# Patient Record
Sex: Male | Born: 1990
Health system: Southern US, Community
[De-identification: ages and names within clinical notes are randomized; demographics above are authoritative.]

## PROBLEM LIST (undated history)

## (undated) DIAGNOSIS — J45909 Unspecified asthma, uncomplicated: Secondary | ICD-10-CM

---
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
## 2010-03-31 NOTE — Assessment & Plan Note (Signed)
Summary: f/u,df   Vital Signs:  Patient profile:   20 year old male Weight:      155 pounds Temp:     98.1 degrees F oral Pulse rate:   75 / minute BP sitting:   124 / 72  (right arm) Cuff size:   regular  Vitals Entered By: Tessie Fass CMA (Jul 05, 2009 4:15 PM) CC: F/U Is Patient Diabetic? No Pain Assessment Patient in pain? no        Primary Care Provider:  Ardeen Garland  MD  CC:  F/U.  History of Present Illness: Edward Grant comes in with his mom for f/u of abdominal issues.  See note from 4/12 for details.  He reports improvement since that visit.  For last 2-3 weeks he has had no issues at all.  He has modified his diet - more fiber, less spicy foods - and is feeling much better.  All symptoms now resolved, or at least appear to be.  THough they are somewhat nervous as symptoms had been present since January as per 4/12 note. Also endorses sneezing, itchy watery eyes, and runny nose.  Like his usual allergies.  Used to be on zyrtec and flonase and they worked well for him.   Habits & Providers  Alcohol-Tobacco-Diet     Tobacco Status: never  Current Medications (verified): 1)  Flonase 50 Mcg/act Susp (Fluticasone Propionate) .... 2 Sprays Each Nostril Daily For Allergies 2)  Zyrtec Allergy 10 Mg Tabs (Cetirizine Hcl) .Marland Kitchen.. 1 Tab By Mouth Daily For Allergies. 3)  Ventolin Hfa 108 (90 Base) Mcg/act Aers (Albuterol Sulfate) .... 2 Sprays Inhaled Q 4 Hrs As Needed Cough or Wheezing or Shortness of Breath  Allergies: No Known Drug Allergies  Physical Exam  General:  alert, NAD, cooperative vitals reviewed.  Eyes:  pupils equal, pupils round, corneas and lenses clear, and no injection.   Ears:  R ear normal and L ear normal.   Nose:  mucosa pale and edematous Mouth:  postnasal drip Lungs:  Normal respiratory effort, chest expands symmetrically. Lungs are clear to auscultation, no crackles or wheezes. Heart:  Normal rate and regular rhythm. S1 and S2 normal without  gallop, murmur, click, rub or other extra sounds. Abdomen:  Bowel sounds positive,abdomen soft and non-tender without masses, organomegaly or hernias noted.   Impression & Recommendations:  Problem # 1:  GASTROENTERITIS WITHOUT DEHYDRATION (ICD-558.9) Assessment Improved  Resolved.  Will defer work up for abdominal pathology (celiac, crohns, UC vs IBS) for nwo as he is improved.  THey are in agreement with this plan.  Will let me know if symptoms return and we can consider GI referral.  The following medications were removed from the medication list:    Loperamide Hcl 2 Mg Tabs (Loperamide hcl) .Marland Kitchen... 2 tabs now, then 1 tab after every loose stool.  no more than 7 tabs in a day.  can be bought over the counter.  Orders: [facility]- Est  Level 4 ([address])  Problem # 2:  ALLERGIC RHINITIS (ICD-477.9) Assessment: New  Restart flonase and zyrtec.  The following medications were removed from the medication list:    Promethazine Hcl 12.5 Mg Tabs (Promethazine hcl) .Marland Kitchen... 1 tab by mouth every 6 hours as needed for nausea His updated medication list for this problem includes:    Flonase 50 Mcg/act Susp (Fluticasone propionate) .Marland Kitchen... 2 sprays each nostril daily for allergies    Zyrtec Allergy 10 Mg Tabs (Cetirizine hcl) .Marland Kitchen... 1 tab by mouth daily  for allergies.  Orders: [facility]- Est  Level 4 ([address])  Complete Medication List: 1)  Flonase 50 Mcg/act Susp (Fluticasone propionate) .... 2 sprays each nostril daily for allergies 2)  Zyrtec Allergy 10 Mg Tabs (Cetirizine hcl) .Marland Kitchen.. 1 tab by mouth daily for allergies. 3)  Ventolin Hfa 108 (90 Base) Mcg/act Aers (Albuterol sulfate) .... 2 sprays inhaled q 4 hrs as needed cough or wheezing or shortness of breath Prescriptions: VENTOLIN HFA 108 (90 BASE) MCG/ACT AERS (ALBUTEROL SULFATE) 2 sprays inhaled q 4 hrs as needed cough or wheezing or shortness of breath  #1 x 3   Entered and Authorized by:   Ardeen Garland  MD   Signed by:   Ardeen Garland  MD on 07/05/2009    Method used:   [facility]  [place] [address]       [address]       [place], [place]  [address]       Ph: [phone] or [phone]       Fax: [phone]   RxID:   [id number] ZYRTEC ALLERGY 10 MG TABS (CETIRIZINE HCL) 1 tab by mouth daily for allergies.  #33 x 5   Entered and Authorized by:   Ardeen Garland  MD   Signed by:   Ardeen Garland  MD on 07/05/2009   Method used:   [facility]        [facility]  [place] [address] [facility]       [address]       [place], [place]  [address]       Ph: [phone] or [phone]       Fax: [phone]   RxID:   [id number] FLONASE 50 MCG/ACT SUSP (FLUTICASONE PROPIONATE) 2 sprays each nostril daily for allergies  #1 x 5   Entered and Authorized by:   Ardeen Garland  MD   Signed by:   Ardeen Garland  MD on 07/05/2009   Method used:   [facility]  [place] [address] [facility]       [address]       [place], [place]  [address]       Ph: [phone] or [phone]       Fax: [phone]   RxID:   [id number]
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
## 2010-03-31 NOTE — Assessment & Plan Note (Signed)
Summary: anxiety,tcb   Vital Signs:  Patient profile:   20 year old male Weight:      155 pounds Pulse rate:   98 / minute BP sitting:   140 / 80  (right arm)  Vitals Entered By: Renato Battles slade,cma CC: feels like has anxiety x 1 year. feels shaky when in a social situation.  Is Patient Diabetic? No Pain Assessment Patient in pain? no        Primary Care Provider:  Ardeen Garland  MD  CC:  feels like has anxiety x 1 year. feels shaky when in a social situation. Marland Kitchen  History of Present Illness: Edward Grant comes in with his mom to talk about anxiety.  He states for the last year he seems to get nervous around people and he might shake a little and his voice quivers.  He stataes he has a lot of friends at school but he isn't allowed to hang out with them much outside of school.  He feels like when he hasn't seen people in awhile he doesn't know what to say and feels out of the looop and this makes him nervous.  Plays video games multiple hours a day in a row.  Mom states she would let him go out with friends but he never asks.  He stated he doesn't ask because she always says no.  Apparently at one point in time was with some friends that caused a little trouble, but better since.  Anxiety does not disrupt his life.  He is able to function normally at school.  Actually, as said, has a lot of friends.  Does okay at home but sometimes voice quivers at home. Also mentions allergies.  His eyes get very itchy and red this time of year.  Doesn't relly have a runny or stuffy  nose.  All eye discomfort.   Physical Exam  Eyes:  conjunctiva inflamed, slightly swollen. Psych:  normal attention and interaction.  No depressed or anxious appearing.  Very pleasant and well-behaved.     Habits & Providers  Alcohol-Tobacco-Diet     Tobacco Status: quit  Social History: Smoking Status:  quit   Impression & Recommendations:  Problem # 1:  ANXIETY (ICD-300.00) Assessment New  Does not seem to be  abnormal anxiety.  Seems like normal teenage social situations and responses.  Also appears to be affected by poor communication between mom and patient.  Though they do seem to have a close relationship.  Discussed trying to communicate more openly.  Discussed as he is getting older he may need a little more freedom to go out with friends but at same time discussed with him he needs to stay out of trouble to keep his mother's trust.  Mother became tearful, feeling liek she was being too clingy.  Reassured her she is a good mother, they just need to communicate more.    Orders: [facility]- Est Level  3 ([address])  Problem # 2:  CONJUNCTIVITIS, ALLERGIC (ICD-372.14) Assessment: New  No nasal symptoms so will try patanol eye drops.  His updated medication list for this problem includes:    Patanol 0.1 % Soln (Olopatadine hcl) .Marland Kitchen... 1 drop in each eye two times a day  dispense: 1 bottle  Orders: [facility]- Est Level  3 ([address])  Medications Added to Medication List This Visit: 1)  Patanol 0.1 % Soln (Olopatadine hcl) .Marland Kitchen.. 1 drop in each eye two times a day  dispense: 1 bottle  Patient Instructions: 1)  Sounds like  you two have a pretty good relationship - you just need to talk more! 2)  Try to limit the video games and TV to less than 2 hours a day and spend more time with friends and people. 3)  Try the patanol drops for you eyes.  If that does't work, let me know. Prescriptions: PATANOL 0.1 % SOLN (OLOPATADINE HCL) 1 drop in each eye two times a day  dispense: 1 bottle  #1 x 5   Entered and Authorized by:   Ardeen Garland  MD   Signed by:   Ardeen Garland  MD on 08/17/2008   Method used:   Print then Give to Patient   RxID:   [id number]

## 2010-03-31 NOTE — Progress Notes (Signed)
Summary: resch   Phone Note Call from Patient   Caller: Mom Summary of Call: called to resch appt. next appt is 6/21 Initial call taken by: De Nurse,  Jul 21, 2008 3:26 PM

## 2010-03-31 NOTE — Assessment & Plan Note (Signed)
Summary: throwing up,tcb   Vital Signs:  Patient profile:   20 year old male Weight:      155 pounds Temp:     97.8 degrees F Pulse rate:   72 / minute BP sitting:   115 / 78  Vitals Entered By: Jone Baseman CMA (June 08, 2009 1:41 PM) CC: throwing up x 3 days Is Patient Diabetic? No Pain Assessment Patient in pain? no        Primary Care Provider:  Ardeen Garland  MD  CC:  throwing up x 3 days.  History of Present Illness: 20 yo male with 3 day history of nausea, vomiting, diarrhea.  Started with vomiting when he awoke on Sunday, then developed diarrhea that same day.  Also with intermittent crampy epigastric pain.  Pain relieved by defecation.  Has had both ever since.  Currently feeling nauseated.  Has vomited x2 today, no bowel movement today.  Denies blood in vomit and stool, fever, dysuria.  Has normal UOP and intake of fluids by mouth.  Has been having similar episodes almost every week for 1-2 days since January.  Also notices intermittent papular rash of upper arms.  Not taking any medications.  Habits & Providers  Alcohol-Tobacco-Diet     Tobacco Status: never  Allergies (verified): No Known Drug Allergies  Social History: Smoking Status:  never  Physical Exam  Additional Exam:  VITALS:  Reviewed, normal GEN: Alert & oriented, no acute distress NECK: Midline trachea, no masses/thyromegaly, no cervical lymphadenopathy CARDIO: Regular rate and rhythm, no murmurs/rubs/gallops, 2+ bilateral radial pulses RESP: Clear to auscultation, normal work of breathing, no retractions/accessory muscle use ABD: Normoactive bowel sounds, nontender, no masses/hepatosplenomegaly EXT: Nontender, no edema SKIN: Papular rash extensor surface of upper arms bilateral    Impression & Recommendations:  Problem # 1:  GASTROENTERITIS WITHOUT DEHYDRATION (ICD-558.9) Assessment New  Symptomatic support for acute episode:  Phenergan, Loperamide.  Acute episode consistent with  acute gastroenteritis, but chronic pattern suggestive of underlying pathology, meets Rome III criteria for IBS but cannot diagnose this without excluding other conditions.  Will check CMet today, f/u 1 week with PCP.  Would consider Celiac testing, stool cultures and 24h stool collection +/- colonoscopy if symptoms persist.  Orders: [facility]- Est Level  3 ([address]) Comp Met-[facility] ([address])  His updated medication list for this problem includes:    Loperamide Hcl 2 Mg Tabs (Loperamide hcl) .Marland Kitchen... 2 tabs now, then 1 tab after every loose stool.  no more than 7 tabs in a day.  can be bought over the counter.  Complete Medication List: 1)  Loperamide Hcl 2 Mg Tabs (Loperamide hcl) .... 2 tabs now, then 1 tab after every loose stool.  no more than 7 tabs in a day.  can be bought over the counter. 2)  Promethazine Hcl 12.5 Mg Tabs (Promethazine hcl) .Marland Kitchen.. 1 tab by mouth every 6 hours as needed for nausea  Patient Instructions: 1)  Pleasure to meet you. 2)  Use Loperamide (over the counter) and Promethazine ([facility]) as directed below. 3)  Please schedule a follow-up appointment in 1 week with Dr. Georgiana Shore. Prescriptions: PROMETHAZINE HCL 12.5 MG TABS (PROMETHAZINE HCL) 1 tab by mouth every 6 hours as needed for nausea  #10 x 0   Entered and Authorized by:   Romero Belling MD   Signed by:   Romero Belling MD on 06/08/2009   Method used:   [facility]  [place] [address]       [address]       [place], [place]  [address]       Ph: [phone] or [phone]       Fax: [phone]   RxID:   [id number]   Appended Document: throwing up,tcb   Medication Administration  Injection # 1:    Medication: Promethazine up to 50mg     Diagnosis: GASTROENTERITIS WITHOUT DEHYDRATION (ICD-558.9)    Route: IM    Site: LUOQ gluteus    Exp Date: 01/28/2011    Lot #: [id number]    Mfr: baxter    Patient tolerated injection without complications    Given by: Jone Baseman CMA (June 08, 2009 2:43 PM)  Orders Added: 1)  Promethazine up to 50mg  [J2550]

## 2010-03-31 NOTE — Progress Notes (Signed)
Summary: Triage   Phone Note Call from Patient Call back at [phone]   Caller: Mom Summary of Call: running a fever of 103, wants to know if he can be seen today Initial call taken by: Haydee Salter,  November 27, 2007 9:06 AM  Follow-up for Phone Call        fever up to 102-103. HA, achy, runny nose. states she thinks he has the flu. advised home care . to use otc to treat symptoms. hx asthma in remote past. she will call if he develops any wheezing or she decides he needs to be seen Follow-up by: Golden Circle RN,  November 27, 2007 10:54 AM
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
## 2010-03-31 NOTE — Progress Notes (Signed)
Summary: [place] request  Phone Note Call from Patient Call back at Home Phone [phone]   Caller: Mom Reason for Call: Talk to Nurse Summary of Call: pt needs to get worked in today for bad leg cramps, throwing up, and fever Initial call taken by: Haydee Salter,  May 01, 2006 8:57 AM  Follow-up for Phone Call        phone # given is d/c'd Same # is in registration screen Follow-up by: Golden Circle RN,  May 01, 2006 9:14 AM   Additional Follow-up for Phone Call Additional follow up Details #2::    Phone call from pt's mom, requesting wi appt. today. Pt c/o fever,legs cramps and flu symptoms. Appt. schedule for today wit Dr. Erenest Rasher.

## 2010-03-31 NOTE — Letter (Signed)
Summary: Results Follow-up Letter  [facility]  [address]   [place], [place] [address]   Phone: [phone]  Fax: [phone]    06/09/2009  [address] [place], [place]  [address]  Dear Mr. LAUNER,   The following are the results of your recent test(s):  Electrolytes -- normal Blood Glucose -- normal Kidney Function -- normal Liver Function -- normal  Sincerely,  Romero Belling MD [facility]  Appended Document: Results Follow-up Letter mailed.

## 2010-03-31 NOTE — Letter (Signed)
Summary: Miguel Aschoff Student [facility]  Medical Examinattion Form  See MD box for hardcopy    Appended Document: [facility] Completed and returned to patient.

## 2010-03-31 NOTE — Assessment & Plan Note (Signed)
Summary: legs cramps,fever/gv   Vital Signs:  Patient Profile:   20 Years Old Male Weight:      137 pounds Temp:     98.1 degrees F Pulse rate:   70 / minute BP sitting:   104 / 61  Vitals Entered By: Jone Baseman CMA (May 01, 2006 1:44 PM)               Acute Pediatric Visit History:      The patient presents with vomiting.  He is not having abdominal pain, constipation, diarrhea, fever, or nausea.  Other comments include: Pt w/ 2 episodes of vomiting this am; no stool changes; has eaten lunch and tolerated s vomiting.        Urine output has been normal.              Physical Exam  General:      well hydrated.   Eyes:      PERRL EOMI Mouth:      MMM Abdomen:      no guarding and no rebound.  + BS; Non TTP    Impression & Recommendations:  Problem # 1:  VOMITING (ICD-787.03) Assessment: Improved Pt requesting note for school; apparently has miss # days; Sx completely resolved; no GI changes, no fever, no Ab pain, thus infectious source unlikey; if conts to have am nausea/vomiting, consider GERD and PPI Orders: [facility]- Est Level  3 ([address])

## 2010-03-31 NOTE — Miscellaneous (Signed)
   Clinical Lists Changes  Problems: Changed problem from ASTHMA, UNSPECIFIED (ICD-493.90) to ASTHMA, INTERMITTENT (ICD-493.90) 

## 2010-04-20 ENCOUNTER — Encounter: Payer: Self-pay | Admitting: *Deleted

## 2010-12-07 LAB — POCT RAPID STREP A: Streptococcus, Group A Screen (Direct): NEGATIVE

## 2013-02-19 ENCOUNTER — Encounter (HOSPITAL_COMMUNITY): Payer: Self-pay | Admitting: Emergency Medicine

## 2013-02-19 ENCOUNTER — Emergency Department (HOSPITAL_COMMUNITY)
Admission: EM | Admit: 2013-02-19 | Discharge: 2013-02-19 | Disposition: A | Discharge status: Home / Self Care | Payer: Self-pay | Attending: Emergency Medicine | Admitting: Emergency Medicine

## 2013-02-19 DIAGNOSIS — R197 Diarrhea, unspecified: Secondary | ICD-10-CM | POA: Insufficient documentation

## 2013-02-19 DIAGNOSIS — J45909 Unspecified asthma, uncomplicated: Secondary | ICD-10-CM | POA: Insufficient documentation

## 2013-02-19 DIAGNOSIS — R5381 Other malaise: Secondary | ICD-10-CM | POA: Insufficient documentation

## 2013-02-19 DIAGNOSIS — Z87891 Personal history of nicotine dependence: Secondary | ICD-10-CM | POA: Insufficient documentation

## 2013-02-19 DIAGNOSIS — R259 Unspecified abnormal involuntary movements: Secondary | ICD-10-CM | POA: Insufficient documentation

## 2013-02-19 DIAGNOSIS — R42 Dizziness and giddiness: Secondary | ICD-10-CM | POA: Insufficient documentation

## 2013-02-19 HISTORY — DX: Unspecified asthma, uncomplicated: J45.909

## 2013-02-19 LAB — BASIC METABOLIC PANEL
BUN: 9 mg/dL (ref 6–23)
CO2: 26 mEq/L (ref 19–32)
Calcium: 9.5 mg/dL (ref 8.4–10.5)
Chloride: 103 mEq/L (ref 96–112)
Creatinine, Ser: 0.83 mg/dL (ref 0.50–1.35)
GFR calc Af Amer: >90 mL/min (ref >90)
GFR calc non Af Amer: >90 mL/min (ref >90)
Glucose, Bld: 110 mg/dL — ABNORMAL HIGH (ref 70–99)
Potassium: 4.1 mEq/L (ref 3.5–5.1)
Sodium: 141 mEq/L (ref 135–145)

## 2013-02-19 LAB — CBC
HCT: 44.2 % (ref 39.0–52.0)
Hemoglobin: 15.5 g/dL (ref 13.0–17.0)
MCH: 32 pg (ref 26.0–34.0)
MCHC: 35.1 g/dL (ref 30.0–36.0)
MCV: 91.1 fL (ref 78.0–100.0)
Platelets: 265 10*3/uL (ref 150–400)
RBC: 4.85 MIL/uL (ref 4.22–5.81)
RDW: 12.7 % (ref 11.5–15.5)
WBC: 10.2 10*3/uL (ref 4.0–10.5)

## 2013-02-19 MED ORDER — SODIUM CHLORIDE 0.9 % IV BOLUS (SEPSIS)
1000.0000 mL | INTRAVENOUS | Status: AC
  Administered 2013-02-19: 1000 mL via INTRAVENOUS

## 2013-02-19 MED ORDER — MECLIZINE HCL 25 MG PO TABS
25.0000 mg | ORAL_TABLET | Freq: Once | ORAL | Status: AC
  Administered 2013-02-19: 25 mg via ORAL
  Filled 2013-02-19: qty 1

## 2013-02-19 MED ORDER — MECLIZINE HCL 12.5 MG PO TABS: 12.5000 mg | ORAL_TABLET | Freq: Three times a day (TID) | ORAL | Status: DC | PRN

## 2013-02-19 NOTE — ED Provider Notes (Signed)
CSN: [id number]     Arrival date & time 02/19/13  1021 History   First MD Initiated Contact with Patient 02/19/13 1133     Chief Complaint  Patient presents with  . Dizziness  . Diarrhea   (Consider location/radiation/quality/duration/timing/severity/associated sxs/prior Treatment) HPI Pt is a 22yo male with hx of asthma c/o waking up feeling dizzy with the room spinning and "very shaky."  Pt states he also felt nauseous but did not vomit. Denies hx of similar symptoms. Pt does mention 3-4 episodes of diarrhea yesterday and today but states that has been intermittent for "many years" pt states he believes he has IBS and believes is unrelated to symptoms of dizziness he is experiencing today. Reports feeling well yesterday and had 8 hours of sleep. No known sick contacts or recent travel.      Past Medical History  Diagnosis Date  . Asthma    History reviewed. No pertinent past surgical history. History reviewed. No pertinent family history. History  Substance Use Topics  . Smoking status: Former Games developer  . Smokeless tobacco: Never Used  . Alcohol Use: Yes    Review of Systems  Constitutional: Negative for fever, chills and fatigue.  Respiratory: Negative for cough and shortness of breath.   Gastrointestinal: Positive for diarrhea ( chronic). Negative for nausea, vomiting and abdominal pain.  Neurological: Positive for dizziness, tremors and weakness. Negative for syncope, light-headedness, numbness and headaches.  All other systems reviewed and are negative.    Allergies  Review of patient's allergies indicates no known allergies.  Home Medications   Current Outpatient Rx  Name  Route  Sig  Dispense  Refill  . meclizine (ANTIVERT) 12.5 MG tablet   Oral   Take 1 tablet (12.5 mg total) by mouth 3 (three) times daily as needed for dizziness or nausea.   30 tablet   0    BP 153/90  Pulse 77  Temp(Src) 97.4 F (36.3 C) (Oral)  Resp 14  SpO2 99% Physical Exam  Nursing  note and vitals reviewed. Constitutional: He is oriented to person, place, and time. He appears well-developed and well-nourished.  HENT:  Head: Normocephalic and atraumatic.  Eyes: Conjunctivae are normal. No scleral icterus.  Neck: Normal range of motion.  Cardiovascular: Normal rate, regular rhythm and normal heart sounds.   Pulmonary/Chest: Effort normal and breath sounds normal. No respiratory distress. He has no wheezes. He has no rales. He exhibits no tenderness.  Abdominal: Soft. Bowel sounds are normal. He exhibits no distension and no mass. There is no tenderness. There is no rebound and no guarding.  Musculoskeletal: Normal range of motion.  Neurological: He is alert and oriented to person, place, and time. He has normal strength. No cranial nerve deficit or sensory deficit. Coordination and gait normal. GCS eye subscore is 4. GCS verbal subscore is 5. GCS motor subscore is 6.  Skin: Skin is warm and dry.    ED Course  Procedures (including critical care time) Labs Review Labs Reviewed  BASIC METABOLIC PANEL - Abnormal; Notable for the following:    Glucose, Bld 110 (*)    All other components within normal limits  CBC   Imaging Review No results found.  EKG Interpretation    Date/Time:  Wednesday February 19 2013 10:37:08 EST Ventricular Rate:  76 PR Interval:  124 QRS Duration: 89 QT Interval:  353 QTC Calculation: 397 R Axis:   53 Text Interpretation:  Sinus rhythm No previous ECGs available Confirmed by Manus Gunning  MD,  STEPHEN ([id number]) on 02/19/2013 11:46:08 AM            MDM   1. Vertigo   2. Diarrhea    Symptoms appear vertiginous in nature.  Fluids and antivert given.  Pt appears well, non-toxic. Vitals: unremarkable. Neuro: normal. CBC and BMP: unremarkable.    Pt stated he felt better after fluids and antivert. All labs/imaging/findings discussed with patient. All questions answered and concerns addressed. Will discharge pt home and have pt f/u with  [facility] and [facility] info provided. Also provided info for Gastroenterology for further evaluation and treatment of possible IBS. Return precautions given. Pt verbalized understanding and agreement with tx plan. Vitals: unremarkable. Discharged in stable condition.         Junius Finner, PA-C 02/19/13 [phone]

## 2013-02-19 NOTE — ED Provider Notes (Signed)
Medical screening examination/treatment/procedure(s) were performed by non-physician practitioner and as supervising physician I was immediately available for consultation/collaboration.  EKG Interpretation    Date/Time:  Wednesday February 19 2013 10:37:08 EST Ventricular Rate:  76 PR Interval:  124 QRS Duration: 89 QT Interval:  353 QTC Calculation: 397 R Axis:   53 Text Interpretation:  Sinus rhythm No previous ECGs available Confirmed by Manus Gunning  MD, Latishia Suitt ([id number]) on 02/19/2013 11:46:08 AM              Glynn Octave, MD 02/19/13 1640

## 2013-02-19 NOTE — ED Notes (Signed)
Pt c/o dizziness and tremors starting around 0730 and intermittent diarrhea x "a long time."  Denies pain.

## 2016-10-02 ENCOUNTER — Encounter (HOSPITAL_COMMUNITY): Payer: Self-pay

## 2016-10-02 ENCOUNTER — Emergency Department (HOSPITAL_COMMUNITY)
Admission: EM | Admit: 2016-10-02 | Discharge: 2016-10-02 | Disposition: A | Discharge status: Home / Self Care | Payer: BLUE CROSS/BLUE SHIELD | Attending: Emergency Medicine | Admitting: Emergency Medicine

## 2016-10-02 DIAGNOSIS — R55 Syncope and collapse: Secondary | ICD-10-CM | POA: Diagnosis present

## 2016-10-02 DIAGNOSIS — F419 Anxiety disorder, unspecified: Secondary | ICD-10-CM | POA: Diagnosis not present

## 2016-10-02 DIAGNOSIS — J45909 Unspecified asthma, uncomplicated: Secondary | ICD-10-CM | POA: Insufficient documentation

## 2016-10-02 DIAGNOSIS — Z87891 Personal history of nicotine dependence: Secondary | ICD-10-CM | POA: Diagnosis not present

## 2016-10-02 LAB — I-STAT CHEM 8, ED
BUN: 6 mg/dL (ref 6–20)
Calcium, Ion: 1.07 mmol/L — ABNORMAL LOW (ref 1.15–1.40)
Chloride: 101 mmol/L (ref 101–111)
Creatinine, Ser: 0.8 mg/dL (ref 0.61–1.24)
Glucose, Bld: 107 mg/dL — ABNORMAL HIGH (ref 65–99)
HCT: 51 % (ref 39.0–52.0)
Hemoglobin: 17.3 g/dL — ABNORMAL HIGH (ref 13.0–17.0)
Potassium: 3.4 mmol/L — ABNORMAL LOW (ref 3.5–5.1)
Sodium: 139 mmol/L (ref 135–145)
TCO2: 25 mmol/L (ref 0–100)

## 2016-10-02 NOTE — ED Triage Notes (Signed)
Pt presents for evaluation of possible allergic reaction. Pt reports woke up this AM and felt fine, reports started drinking coffee and had numbness/tingling to mouth with blood shot eyes. Patient reports some cramping to extremities as well. Pt denies new food/creams/exposures. Reports started taking fluoxetine and bupropion about a month ago with no other issues.

## 2016-10-02 NOTE — ED Notes (Signed)
Patient given discharge instructions and verbalized understanding.  Patient stable to discharge at this time.  Patient is alert and oriented to baseline.  No distressed noted at this time.  All belongings taken with the patient at discharge.   
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
## 2016-11-19 ENCOUNTER — Emergency Department (HOSPITAL_COMMUNITY)
Admission: EM | Admit: 2016-11-19 | Discharge: 2016-11-19 | Discharge status: Against Medical Advice | Payer: BLUE CROSS/BLUE SHIELD

## 2016-11-19 ENCOUNTER — Encounter (HOSPITAL_COMMUNITY): Payer: Self-pay | Admitting: *Deleted

## 2016-11-19 ENCOUNTER — Emergency Department (HOSPITAL_COMMUNITY)
Admission: EM | Admit: 2016-11-19 | Discharge: 2016-11-20 | Disposition: A | Discharge status: Other Acute Inpatient Hospital | Payer: BLUE CROSS/BLUE SHIELD | Attending: Emergency Medicine | Admitting: Emergency Medicine

## 2016-11-19 DIAGNOSIS — R45851 Suicidal ideations: Secondary | ICD-10-CM | POA: Insufficient documentation

## 2016-11-19 DIAGNOSIS — F332 Major depressive disorder, recurrent severe without psychotic features: Secondary | ICD-10-CM | POA: Diagnosis not present

## 2016-11-19 DIAGNOSIS — F1014 Alcohol abuse with alcohol-induced mood disorder: Secondary | ICD-10-CM | POA: Diagnosis present

## 2016-11-19 DIAGNOSIS — Z87891 Personal history of nicotine dependence: Secondary | ICD-10-CM | POA: Diagnosis not present

## 2016-11-19 DIAGNOSIS — J45909 Unspecified asthma, uncomplicated: Secondary | ICD-10-CM | POA: Diagnosis not present

## 2016-11-19 DIAGNOSIS — F1010 Alcohol abuse, uncomplicated: Secondary | ICD-10-CM | POA: Diagnosis not present

## 2016-11-19 DIAGNOSIS — F329 Major depressive disorder, single episode, unspecified: Secondary | ICD-10-CM | POA: Diagnosis present

## 2016-11-19 NOTE — ED Notes (Signed)
Bed: [facility] Expected date:  Expected time:  Means of arrival:  Comments: 

## 2016-11-19 NOTE — ED Notes (Signed)
Bed: [facility]6 Expected date:  Expected time:  Means of arrival:  Comments: 
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
## 2016-11-19 NOTE — ED Provider Notes (Signed)
[facility]-EMER[facility] DEPT Provider Note   CSN: [id number] Arrival date & time: 11/19/16  2244     History   Chief Complaint No chief complaint on file.   HPI Edward Grant is a 26 y.o. male.  Patient is a 26 year old male with past medical history of asthma and depression treated with Prozac He was brought by on 47 for evaluation of suicidal threats. According to the IVC paperwork which was filled out by his mother, he was overheard by his sister making remarks that he wanted to harm himself. The mother also is concerned about his excessive use of alcohol and feels as though he is a danger to himself. The patient denies to me that he is suicidal or homicidal and states that he made some remarks that he regrets while he was intoxicated. He has no complaints. 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   The history is provided by the patient.    Past Medical History:  Diagnosis Date  . Asthma     Patient Active Problem List   Diagnosis Date Noted  . ALLERGIC RHINITIS 07/05/2009  . GASTROENTERITIS WITHOUT DEHYDRATION 06/08/2009  . ANXIETY 08/17/2008  . CONJUNCTIVITIS, ALLERGIC 04/26/2006  . ASTHMA, INTERMITTENT 04/26/2006    History reviewed. No pertinent surgical history.     Home Medications    Prior to Admission medications   Medication Sig Start Date End Date Taking? Authorizing Provider  meclizine (ANTIVERT) 12.5 MG tablet Take 1 tablet (12.5 mg total) by mouth 3 (three) times daily as needed for dizziness or nausea. 02/19/13   Lurene Shadow, PA-C    Family History No family history on file.  Social History Social History  Substance Use Topics  . Smoking status: Former Games developer  . Smokeless tobacco: Never Used  . Alcohol use Yes     Allergies   Patient has no known allergies.   Review of Systems Review of Systems  All other systems reviewed and are negative.    Physical  Exam Updated Vital Signs BP (!) 134/95 (BP Location: Left Arm)   Pulse (!) 104   Temp 98.3 F (36.8 C) (Oral)   Resp 18   SpO2 96%   Physical Exam  Constitutional: He is oriented to person, place, and time. He appears well-developed and well-nourished. No distress.  HENT:  Head: Normocephalic and atraumatic.  Mouth/Throat: Oropharynx is clear and moist.  Neck: Normal range of motion. Neck supple.  Cardiovascular: Normal rate and regular rhythm.  Exam reveals no friction rub.   No murmur heard. Pulmonary/Chest: Effort normal and breath sounds normal. No respiratory distress. He has no wheezes. He has no rales.  Abdominal: Soft. Bowel sounds are normal. He exhibits no distension. There is no tenderness.  Musculoskeletal: Normal range of motion. He exhibits no edema.  Neurological: He is alert and oriented to person, place, and time. Coordination normal.  Skin: Skin is warm and dry. He is not diaphoretic.  Psychiatric: He has a normal mood and affect. His speech is normal and behavior is normal. Judgment and thought content normal. He is not actively hallucinating. Cognition and memory are normal. He is attentive.  Nursing note and vitals reviewed.    ED Treatments / Results  Labs (all labs ordered are listed, but only abnormal results are displayed) Labs Reviewed - No data to display  EKG  EKG Interpretation None  Radiology No results found.  Procedures Procedures (including critical care time)  Medications Ordered in ED Medications - No data to display   Initial Impression / Assessment and Plan / ED Course  I have reviewed the triage vital signs and the nursing notes.  Pertinent labs & imaging results that were available during my care of the patient were reviewed by me and considered in my medical decision making (see chart for details).  Patient seen by TTS and felt to meet inpatient criteria. The patient was very unhappy with this. Workup reveals no  significant laboratory abnormality and his physical examination is nonfocal. His blood alcohol is 170. I feel as though he is medically cleared for psychiatric treatment.  Final Clinical Impressions(s) / ED Diagnoses   Final diagnoses:  None    New Prescriptions New Prescriptions   No medications on file     Geoffery Lyons, MD 11/20/16 [phone]

## 2016-11-20 ENCOUNTER — Inpatient Hospital Stay (HOSPITAL_COMMUNITY)
Admission: AD | Admit: 2016-11-20 | Discharge: 2016-11-22 | DRG: 885 | Disposition: A | Discharge status: Home / Self Care | Payer: BLUE CROSS/BLUE SHIELD | Attending: Psychiatry | Admitting: Psychiatry

## 2016-11-20 ENCOUNTER — Encounter (HOSPITAL_COMMUNITY): Payer: Self-pay | Admitting: *Deleted

## 2016-11-20 DIAGNOSIS — Z79899 Other long term (current) drug therapy: Secondary | ICD-10-CM

## 2016-11-20 DIAGNOSIS — F1020 Alcohol dependence, uncomplicated: Secondary | ICD-10-CM | POA: Diagnosis present

## 2016-11-20 DIAGNOSIS — F419 Anxiety disorder, unspecified: Secondary | ICD-10-CM | POA: Diagnosis present

## 2016-11-20 DIAGNOSIS — Z87891 Personal history of nicotine dependence: Secondary | ICD-10-CM | POA: Diagnosis not present

## 2016-11-20 DIAGNOSIS — Z818 Family history of other mental and behavioral disorders: Secondary | ICD-10-CM

## 2016-11-20 DIAGNOSIS — R450 Nervousness: Secondary | ICD-10-CM | POA: Diagnosis not present

## 2016-11-20 DIAGNOSIS — G4700 Insomnia, unspecified: Secondary | ICD-10-CM | POA: Diagnosis present

## 2016-11-20 DIAGNOSIS — F1014 Alcohol abuse with alcohol-induced mood disorder: Secondary | ICD-10-CM | POA: Diagnosis present

## 2016-11-20 DIAGNOSIS — F332 Major depressive disorder, recurrent severe without psychotic features: Secondary | ICD-10-CM | POA: Diagnosis present

## 2016-11-20 DIAGNOSIS — Y906 Blood alcohol level of 120-199 mg/100 ml: Secondary | ICD-10-CM | POA: Diagnosis present

## 2016-11-20 DIAGNOSIS — F17210 Nicotine dependence, cigarettes, uncomplicated: Secondary | ICD-10-CM | POA: Diagnosis not present

## 2016-11-20 DIAGNOSIS — F329 Major depressive disorder, single episode, unspecified: Secondary | ICD-10-CM | POA: Diagnosis present

## 2016-11-20 DIAGNOSIS — J4520 Mild intermittent asthma, uncomplicated: Secondary | ICD-10-CM | POA: Diagnosis present

## 2016-11-20 DIAGNOSIS — F1010 Alcohol abuse, uncomplicated: Secondary | ICD-10-CM | POA: Diagnosis present

## 2016-11-20 LAB — CBC WITH DIFFERENTIAL/PLATELET
Basophils Absolute: 0.1 10*3/uL (ref 0.0–0.1)
Basophils Relative: 1 %
Eosinophils Absolute: 1.5 10*3/uL — ABNORMAL HIGH (ref 0.0–0.7)
Eosinophils Relative: 14 %
HCT: 44.9 % (ref 39.0–52.0)
Hemoglobin: 16 g/dL (ref 13.0–17.0)
Lymphocytes Relative: 30 %
Lymphs Abs: 3.3 10*3/uL (ref 0.7–4.0)
MCH: 33.1 pg (ref 26.0–34.0)
MCHC: 35.6 g/dL (ref 30.0–36.0)
MCV: 93 fL (ref 78.0–100.0)
Monocytes Absolute: 1.2 10*3/uL — ABNORMAL HIGH (ref 0.1–1.0)
Monocytes Relative: 11 %
Neutro Abs: 4.9 10*3/uL (ref 1.7–7.7)
Neutrophils Relative %: 44 %
Platelets: 300 10*3/uL (ref 150–400)
RBC: 4.83 MIL/uL (ref 4.22–5.81)
RDW: 12.7 % (ref 11.5–15.5)
WBC: 11 10*3/uL — ABNORMAL HIGH (ref 4.0–10.5)

## 2016-11-20 LAB — ETHANOL: Alcohol, Ethyl (B): 170 mg/dL — ABNORMAL HIGH (ref <5)

## 2016-11-20 LAB — BASIC METABOLIC PANEL
Anion gap: 14 (ref 5–15)
BUN: 8 mg/dL (ref 6–20)
CO2: 22 mmol/L (ref 22–32)
Calcium: 8.8 mg/dL — ABNORMAL LOW (ref 8.9–10.3)
Chloride: 107 mmol/L (ref 101–111)
Creatinine, Ser: 0.84 mg/dL (ref 0.61–1.24)
GFR calc Af Amer: >60 mL/min (ref >60)
GFR calc non Af Amer: >60 mL/min (ref >60)
Glucose, Bld: 102 mg/dL — ABNORMAL HIGH (ref 65–99)
Potassium: 3.6 mmol/L (ref 3.5–5.1)
Sodium: 143 mmol/L (ref 135–145)

## 2016-11-20 MED ORDER — ACETAMINOPHEN 325 MG PO TABS
650.0000 mg | ORAL_TABLET | Freq: Four times a day (QID) | ORAL | Status: DC | PRN
  Administered 2016-11-20 – 2016-11-22 (×4): 650 mg via ORAL
  Filled 2016-11-20 (×4): qty 2

## 2016-11-20 MED ORDER — LORAZEPAM 1 MG PO TABS: 0.0000 mg | ORAL_TABLET | Freq: Two times a day (BID) | ORAL | Status: DC

## 2016-11-20 MED ORDER — LORAZEPAM 1 MG PO TABS
0.0000 mg | ORAL_TABLET | Freq: Four times a day (QID) | ORAL | Status: DC
  Administered 2016-11-20: 1 mg via ORAL
  Filled 2016-11-20: qty 1

## 2016-11-20 MED ORDER — THIAMINE HCL 100 MG/ML IJ SOLN: 100.0000 mg | Freq: Every day | INTRAMUSCULAR | Status: DC

## 2016-11-20 MED ORDER — VITAMIN B-1 100 MG PO TABS
100.0000 mg | ORAL_TABLET | Freq: Every day | ORAL | Status: DC
  Administered 2016-11-21 – 2016-11-22 (×2): 100 mg via ORAL
  Filled 2016-11-20 (×3): qty 1

## 2016-11-20 MED ORDER — LORAZEPAM 2 MG/ML IJ SOLN: 0.0000 mg | Freq: Four times a day (QID) | INTRAMUSCULAR | Status: DC

## 2016-11-20 MED ORDER — MAGNESIUM HYDROXIDE 400 MG/5ML PO SUSP: 30.0000 mL | Freq: Every day | ORAL | Status: DC | PRN

## 2016-11-20 MED ORDER — ALUM & MAG HYDROXIDE-SIMETH 200-200-20 MG/5ML PO SUSP: 30.0000 mL | ORAL | Status: DC | PRN

## 2016-11-20 MED ORDER — FLUOXETINE HCL 20 MG PO CAPS
20.0000 mg | ORAL_CAPSULE | Freq: Every day | ORAL | Status: DC
  Administered 2016-11-21: 20 mg via ORAL
  Filled 2016-11-20 (×4): qty 1

## 2016-11-20 MED ORDER — GABAPENTIN 100 MG PO CAPS
200.0000 mg | ORAL_CAPSULE | Freq: Two times a day (BID) | ORAL | Status: DC
  Filled 2016-11-20: qty 2

## 2016-11-20 MED ORDER — FLUOXETINE HCL 20 MG PO CAPS
20.0000 mg | ORAL_CAPSULE | Freq: Every day | ORAL | Status: DC
  Administered 2016-11-20: 20 mg via ORAL
  Filled 2016-11-20: qty 1

## 2016-11-20 MED ORDER — LORAZEPAM 2 MG/ML IJ SOLN: 0.0000 mg | Freq: Two times a day (BID) | INTRAMUSCULAR | Status: DC

## 2016-11-20 MED ORDER — VITAMIN B-1 100 MG PO TABS
100.0000 mg | ORAL_TABLET | Freq: Every day | ORAL | Status: DC
  Administered 2016-11-20: 100 mg via ORAL
  Filled 2016-11-20: qty 1

## 2016-11-20 NOTE — ED Notes (Signed)
Psychiatry at bedside.

## 2016-11-20 NOTE — BHH Counselor (Signed)
Per Dr. Akintayo and Jamison Lord, DNP patient meets criteria for inpatient.  

## 2016-11-20 NOTE — BH Assessment (Signed)
Patient accepted for involuntary admission to [facility], [facility] [place], bed #305-2. Please call report to [phone].

## 2016-11-20 NOTE — Progress Notes (Signed)
  Admission Note:  26 year old male who presents IVC, in no acute distress, for the treatment of SI and substance abuse. Patient appeared anxious and tearful throughout admission.  Patient was cooperative with admission process. Patient denies SI on admission stating "I made a suicidal threat to my sister to get under her skin but I didn't mean it". Patient denies AVH.  Patient identifies main stressor as "6 months ago my dad had 3-4 strokes and was in ICU for 3 weeks. I am now his primary care giver and it is a lot of pressure".  Patient states that since father's stroke, patient has been drinking "a 6 pack everyday for 6 months".  Patient verbalizes worry about being at [facility] and not being able to take care of father "give him his medications and make sure he eats". Patient also, verbalized worry about missing out on work from inpatient admission and not being able to cover his bills.  Patient currently lives with father and identifies "friends" as his support system.  While at [facility], patient would like to work on "anxiety" and "drinking".   Skin was assessed.  Patient has bruise to left forearm, left knee, abrasions to elbows bilateral, right eye, and upper lip.  Patient searched and no contraband found, POC and unit policies explained and understanding verbalized. Consents obtained. Food and fluids offered and accepted. Patient had no additional questions or concerns.

## 2016-11-20 NOTE — Progress Notes (Signed)
D   Pt has isolated to his room and has had no complaints   He presents as depressed and anxious    He continues to deny suicidal ideation and denies withdrawal A    Verbal support given   Medications offered     monitored    Q 15 min checks R   Pt remains safe
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
## 2016-11-20 NOTE — Tx Team (Signed)
Initial Treatment Plan 11/20/2016 5:06 PM Edward Grant YQM:[id number]    PATIENT STRESSORS: Financial difficulties Marital or family conflict Substance abuse   PATIENT STRENGTHS: Ability for insight Average or above average intelligence Communication skills Motivation for treatment/growth Physical Health Supportive family/friends   PATIENT IDENTIFIED PROBLEMS: At risk for suicide  Substance Abuse  "anxiety"  "Drinking"               DISCHARGE CRITERIA:  Ability to meet basic life and health needs Improved stabilization in mood, thinking, and/or behavior Motivation to continue treatment in a less acute level of care Need for constant or close observation no longer present Verbal commitment to aftercare and medication compliance Withdrawal symptoms are absent or subacute and managed without 24-hour nursing intervention  PRELIMINARY DISCHARGE PLAN: Attend 12-step recovery group Outpatient therapy Return to previous living arrangement Return to previous work or school arrangements  PATIENT/FAMILY INVOLVEMENT: This treatment plan has been presented to and reviewed with the patient, Edward Grant.  The patient and family have been given the opportunity to ask questions and make suggestions.  Carleene Overlie, RN 11/20/2016, 5:06 PM

## 2016-11-20 NOTE — ED Notes (Signed)
Pt unable to provide urine specimen. He stated, "I went about ten minutes ago."

## 2016-11-20 NOTE — ED Notes (Signed)
[employer] arrived to transport pt.  

## 2016-11-21 DIAGNOSIS — G4700 Insomnia, unspecified: Secondary | ICD-10-CM

## 2016-11-21 DIAGNOSIS — R450 Nervousness: Secondary | ICD-10-CM

## 2016-11-21 DIAGNOSIS — F17210 Nicotine dependence, cigarettes, uncomplicated: Secondary | ICD-10-CM

## 2016-11-21 DIAGNOSIS — F1020 Alcohol dependence, uncomplicated: Secondary | ICD-10-CM | POA: Diagnosis present

## 2016-11-21 DIAGNOSIS — F419 Anxiety disorder, unspecified: Secondary | ICD-10-CM

## 2016-11-21 DIAGNOSIS — F332 Major depressive disorder, recurrent severe without psychotic features: Principal | ICD-10-CM

## 2016-11-21 MED ORDER — ONDANSETRON 4 MG PO TBDP: 4.0000 mg | ORAL_TABLET | Freq: Four times a day (QID) | ORAL | Status: DC | PRN

## 2016-11-21 MED ORDER — ADULT MULTIVITAMIN W/MINERALS CH
1.0000 | ORAL_TABLET | Freq: Every day | ORAL | Status: DC
  Administered 2016-11-21 – 2016-11-22 (×2): 1 via ORAL
  Filled 2016-11-21 (×5): qty 1

## 2016-11-21 MED ORDER — LORAZEPAM 1 MG PO TABS: 1.0000 mg | ORAL_TABLET | Freq: Two times a day (BID) | ORAL | Status: DC

## 2016-11-21 MED ORDER — LORAZEPAM 1 MG PO TABS: 1.0000 mg | ORAL_TABLET | Freq: Three times a day (TID) | ORAL | Status: DC

## 2016-11-21 MED ORDER — LOPERAMIDE HCL 2 MG PO CAPS: 2.0000 mg | ORAL_CAPSULE | ORAL | Status: DC | PRN

## 2016-11-21 MED ORDER — LORAZEPAM 1 MG PO TABS: 1.0000 mg | ORAL_TABLET | Freq: Four times a day (QID) | ORAL | Status: DC

## 2016-11-21 MED ORDER — FLUOXETINE HCL 20 MG PO CAPS
60.0000 mg | ORAL_CAPSULE | Freq: Every day | ORAL | Status: DC
  Administered 2016-11-22: 60 mg via ORAL
  Filled 2016-11-21 (×3): qty 3

## 2016-11-21 MED ORDER — FLUOXETINE HCL 20 MG PO CAPS
40.0000 mg | ORAL_CAPSULE | Freq: Once | ORAL | Status: AC
  Administered 2016-11-21: 40 mg via ORAL
  Filled 2016-11-21: qty 2

## 2016-11-21 MED ORDER — LORAZEPAM 1 MG PO TABS: 1.0000 mg | ORAL_TABLET | Freq: Four times a day (QID) | ORAL | Status: DC | PRN

## 2016-11-21 MED ORDER — VITAMIN B-1 100 MG PO TABS
100.0000 mg | ORAL_TABLET | Freq: Every day | ORAL | Status: DC
  Administered 2016-11-22: 100 mg via ORAL
  Filled 2016-11-21 (×3): qty 1

## 2016-11-21 MED ORDER — HYDROXYZINE HCL 25 MG PO TABS
25.0000 mg | ORAL_TABLET | Freq: Four times a day (QID) | ORAL | Status: DC | PRN
  Administered 2016-11-21: 25 mg via ORAL
  Filled 2016-11-21: qty 1

## 2016-11-21 MED ORDER — LORAZEPAM 1 MG PO TABS: 1.0000 mg | ORAL_TABLET | Freq: Every day | ORAL | Status: DC

## 2016-11-21 NOTE — Progress Notes (Signed)
Recreation Therapy Notes  Animal-Assisted Activity (AAA) Program Checklist/Progress Notes Patient Eligibility Criteria Checklist & Daily Group note for Rec TxIntervention  Date: 09.26.2018 Time: 2:45pm Location: [address]   AAA/T Program Assumption of Risk Form signed by Patient/ or Parent Legal Guardian Yes  Patient is free of allergies or sever asthma Yes  Patient reports no fear of animals Yes  Patient reports no history of cruelty to animals Yes  Patient understands his/her participation is voluntary Yes  Behavioral Response: Did not attend.   Marykay Lex Rica Heather, LRT/CTRS        Myldred Raju L 11/21/2016 3:04 PM

## 2016-11-21 NOTE — BHH Group Notes (Signed)
The focus of this group is to educate the patient on the purpose and policies of crisis stabilization and provide a format to answer questions about their admission.  The group details unit policies and expectations of patients while admitted.  Patient did not attend 0900 nurse education orientation group this morning.  Patient stayed in room.  

## 2016-11-21 NOTE — BHH Group Notes (Signed)
LCSW Group Therapy Note  11/21/2016 1:15pm  Type of Therapy/Topic:  Group Therapy:  Feelings about Diagnosis  Participation Level: Pt invited. Did not attend.   Jonathon Jordan, MSW, LCSWA 11/21/2016 3:45 PM
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
## 2016-11-21 NOTE — BHH Suicide Risk Assessment (Signed)
[facility] Admission Suicide Risk Assessment   Nursing information obtained from:  Patient Demographic factors:  Male Current Mental Status:  Suicidal ideation indicated by others, Suicide plan Loss Factors:  Loss of significant relationship Historical Factors:  Family history of mental illness or substance abuse Risk Reduction Factors:  Living with another person, especially a relative, Positive social support  Total Time spent with patient: 30 minutes Principal Problem: Alcohol use disorder, severe, dependence (HCC) Diagnosis:   Patient Active Problem List   Diagnosis Date Noted  . Alcohol use disorder, severe, dependence (HCC) [F10.20] 11/21/2016  . Alcohol abuse [F10.10] 11/20/2016  . Major depressive disorder, recurrent severe without psychotic features (HCC) [F33.2] 11/20/2016  . ALLERGIC RHINITIS [J30.9] 07/05/2009  . GASTROENTERITIS WITHOUT DEHYDRATION [K52.89] 06/08/2009  . ANXIETY [F41.1] 08/17/2008  . CONJUNCTIVITIS, ALLERGIC [H10.45] 04/26/2006  . ASTHMA, INTERMITTENT [J45.909] 04/26/2006   Subjective Data:  26 y.o Caucasian male, single, lives alone, employed. Personal and family history of addiction. Reports regular use of alcohol. Involuntarily committed by his mother. Reported to have expressed suicidal thoughts while drinking at the bar. BAL at presentation was 170 mg/dl. Patient says his statement was taken out of context. Says he had an argument with his sister while they were watching football. Says he told her that he did not care if he died. Says he never had any suicidal thoughts. Says he went back home and was ready to go to work the following day when the police came. Repeatedly denies any suicidal thoughts. Denies any past suicidal behavior. Feels his mom is concerned because he has been taking Prozac. Says his mom had a bad reaction from Prozac. Patient says he has had a good experience from Prozac. Says he has been having less anxiety on it. Wants to continue his  medication. Says he is feeling good and hopes to back to work soon. Says he has never had withdrawals in the past. No sweatiness, no headaches. No retching, nausea or vomiting. No fullness in the head. No visual, tactile or auditory hallucination. No internal restlessness. No homicidal thoughts.  Says he is able to think clearly. No past suicidal behavior. No family history of suicide. No evidence of mania. No overwhelming anxiety. No insomnia.  We discussed detox and continuation of his SSRI. I explored anticraving medications with him. Patient is not processing relapse preventive measures at this time.    Continued Clinical Symptoms:  Alcohol Use Disorder Identification Test Final Score (AUDIT): 16 The "Alcohol Use Disorders Identification Test", Guidelines for Use in Primary Care, Second Edition.  World Science writer [facility]). Score between 0-7:  no or low risk or alcohol related problems. Score between 8-15:  moderate risk of alcohol related problems. Score between 16-19:  high risk of alcohol related problems. Score 20 or above:  warrants further diagnostic evaluation for alcohol dependence and treatment.   CLINICAL FACTORS:   Depression:   Impulsivity Alcohol/Substance Abuse/Dependencies   Musculoskeletal: Strength & Muscle Tone: within normal limits Gait & Station: normal Patient leans: N/A  Psychiatric Specialty Exam: Physical Exam  Constitutional: He is oriented to person, place, and time. He appears well-developed and well-nourished.  HENT:  Head: Normocephalic and atraumatic.  Respiratory: Effort normal.  Neurological: He is alert and oriented to person, place, and time.  Skin: Skin is warm and dry.  Psychiatric:  As above    ROS  Blood pressure 134/84, pulse 67, temperature 98.2 F (36.8 C), temperature source Oral, resp. rate 18, height  (1.778 m), weight 100.7  kg (222 lb), SpO2 99 %.Body mass index is 31.85 kg/m.  General Appearance: Neatly dressed, Not  shaky, not sweaty, not confused. Not unsteady, normal conjugate eye movements. Not internally distressed. Appropriate behavior.   Eye Contact:  Good  Speech:  Clear and Coherent and Normal Rate  Volume:  Normal  Mood:  Says he feels good  Affect:  Appropriate and Restricted  Thought Process:  Linear  Orientation:  Full (Time, Place, and Person)  Thought Content:  No delusional theme. No preoccupation with violent thoughts. No negative ruminations. No obsession.  No hallucination in any modality.   Suicidal Thoughts:  No  Homicidal Thoughts:  No  Memory:  Immediate;   Good Recent;   Good Remote;   Good  Judgement:  Good  Insight:  Fair  Psychomotor Activity:  Normal  Concentration:  Concentration: Good and Attention Span: Good  Recall:  Good  Fund of Knowledge:  Good  Language:  Good  Akathisia:  Negative  Handed:    AIMS (if indicated):     Assets:  Communication Skills Desire for Improvement Physical Health Resilience Transportation Vocational/Educational  ADL's:  Intact  Cognition:  WNL  Sleep:  Number of Hours: 5.75      COGNITIVE FEATURES THAT CONTRIBUTE TO RISK:  None    SUICIDE RISK:   Minimal: No identifiable suicidal ideation.  Patients presenting with no risk factors but with morbid ruminations; may be classified as minimal risk based on the severity of the depressive symptoms  PLAN OF CARE:  1. Alcohol withdrawal protocol 2. Continue Prozac at current dose 3. Collateral from his family Allow voluntary status.    I certify that inpatient services furnished can reasonably be expected to improve the patient's condition.   Georgiann Cocker, MD 11/21/2016, 5:50 PM
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
## 2016-11-21 NOTE — Progress Notes (Signed)
Adult Psychoeducational Group Note  Date:  11/21/2016 Time:  3:17 AM  Group Topic/Focus:  Wrap-Up Group:   The focus of this group is to help patients review their daily goal of treatment and discuss progress on daily workbooks.  Participation Level:  Did Not Attend  Participation Quality:  Did not attend  Affect:  Did not attend  Cognitive:  Did not attend  Insight: None  Engagement in Group:  Did not attend  Modes of Intervention:  Did not attend  Additional Comments:  Pt did not attend the AA group session today.  Felipa Furnace 11/21/2016, 3:17 AM

## 2016-11-22 MED ORDER — FLUOXETINE HCL 20 MG PO CAPS: 60.0000 mg | ORAL_CAPSULE | Freq: Every day | ORAL | 0 refills | Status: AC

## 2016-11-22 MED ORDER — HYDROXYZINE HCL 25 MG PO TABS: 25.0000 mg | ORAL_TABLET | Freq: Four times a day (QID) | ORAL | 0 refills | Status: DC | PRN

## 2016-11-22 NOTE — Progress Notes (Signed)
  [facility] Adult Case Management Discharge Plan :  Will you be returning to the same living situation after discharge:  Yes,  pt returning home. At discharge, do you have transportation home?: Yes,  pt's sister will transport. Do you have the ability to pay for your medications: Yes,  pt has insurance.  Release of information consent forms completed and in the chart;  Patient's signature needed at discharge.  Patient to Follow up at: Follow-up Information    [facility] Follow up.   Specialty:  Behavioral Health Why:  Therapy appointment 10/15 at 11am w/ Brayton Caves. Please arrive no later than 10am to complete paperwork. Medication managment appointment 10/26 at 9am w/ Dr. Lolly Mustache. These were the first available appointments. Please call the office if you need to cancel. Contact information: [address] [place] [address] [phone]       [facility] Follow up.   Why:  Social worker has made a referral on your behalf. Please call the office upon discharge to schedule an appointment. If they have a therapy appointment available sooner than 10/15 please call [facility] to cancel your other appointment. Thank you. Contact information: [address] [place] [address] [phone]           Next level of care provider has access to [facility] Link:yes  Safety Planning and Suicide Prevention discussed: Yes,  with pt, pt's sister, and pt;s mother.  Have you used any form of tobacco in the last 30 days? (Cigarettes, Smokeless Tobacco, Cigars, and/or Pipes): Yes  Has patient been referred to the Quitline?: Patient refused referral  Patient has been referred for addiction treatment: Yes  Jonathon Jordan, MSW, LCSWA 11/22/2016, 4:09 PM

## 2016-11-22 NOTE — Progress Notes (Signed)
D   Pt is appropriate and pleasant   He denies suicidal and homicidal ideation    He signed his voluntary consent for treatment and his 72 hour request for discharge   He said he is worried about his invalid father and his care not that he is in the hospital A   Verbal support given medications administered and effectivenes monitored   Q 15 min checks R   Pt is safe and receptive to verbal support

## 2016-11-22 NOTE — [facility] Suicide Risk Assessment (Signed)
[facility] INPATIENT:  Family/Significant Other Suicide Prevention Education  Suicide Prevention Education:  Education Completed; Ailene Ravel (sister [phone]), has been identified by the patient as the family member/significant other with whom the patient will be residing, and identified as the person(s) who will aid the patient in the event of a mental health crisis (suicidal ideations/suicide attempt).  With written consent from the patient, the family member/significant other has been provided the following suicide prevention education, prior to the and/or following the discharge of the patient.  The suicide prevention education provided includes the following:  Suicide risk factors  Suicide prevention and interventions  National Suicide Hotline telephone number  [facility] assessment telephone number  [facility] Emergency Assistance 911  [facility] and/or Residential Mobile Crisis Unit telephone number  Request made of family/significant other to:  Remove weapons (e.g., guns, rifles, knives), all items previously/currently identified as safety concern.    Remove drugs/medications (over-the-counter, prescriptions, illicit drugs), all items previously/currently identified as a safety concern.  The family member/significant other verbalizes understanding of the suicide prevention education information provided.  The family member/significant other agrees to remove the items of safety concern listed above.  Pt's sister states that pt was not suicidal prior to admission. Pt's sister states that pt has been under a lot of stress lately because he is the primary caretaker for their father. Pt's sister reports that while pt was drinking one night he stated "I don't care if I live or die". However, she believes that the pt was just frustrated in that moment and truly did not mean what he said. Pt's sister told their mom about what pt said and pt's mother immediately IVC'd  pt without talking to him first. Pt's sister states that pt's mother hasn't seen either of them in months and they usually only see their mother on holidays. Pt's sister states that pt's mother does not know the full situation of what is going on with the pt and overreacted based on the information that was given. Pt's sister states that she believes that pt is stable for discharge and has no concerns about pt leaving today. Pt's sister also confirms that pt does not have access to guns or weapons at home.  Jonathon Jordan, MSW, LCSWA  11/22/2016, 11:43 AM

## 2016-11-22 NOTE — [facility] Suicide Risk Assessment (Signed)
[facility] INPATIENT:  Family/Significant Other Suicide Prevention Education  Suicide Prevention Education:  Education Completed; Lawson Fiscal (mom [phone]), has been identified by the patient as the family member/significant other with whom the patient will be residing, and identified as the person(s) who will aid the patient in the event of a mental health crisis (suicidal ideations/suicide attempt).  With written consent from the patient, the family member/significant other has been provided the following suicide prevention education, prior to the and/or following the discharge of the patient.  The suicide prevention education provided includes the following:  Suicide risk factors  Suicide prevention and interventions  National Suicide Hotline telephone number  [facility] assessment telephone number  [facility] Emergency Assistance 911  [facility] and/or Residential Mobile Crisis Unit telephone number  Request made of family/significant other to:  Remove weapons (e.g., guns, rifles, knives), all items previously/currently identified as safety concern.    Remove drugs/medications (over-the-counter, prescriptions, illicit drugs), all items previously/currently identified as a safety concern.  The family member/significant other verbalizes understanding of the suicide prevention education information provided.  The family member/significant other agrees to remove the items of safety concern listed above.  Pt's mother states that she IVC'd pt based off the information that pt's sister told her. Pt's mother states that she never talked to the pt prior to IVC'ing him and also did not see the pt. Pt's mother states that pt's sister may have made the situation seem worse to her than what it really was. Pt's mother states that she works in [employer] so she was just trying to do the right thing for her son. Pt's mother reports that she hasn't seen the pt in a while because she's  been busy with work. However, she plans to spend more time with the pt upon discharge. Pt's mother states that she thinks pt is stable for discharge and she has no concerns about him leaving today. Pt's mother states "I know he will be fine".  Edward Grant, MSW, LCSWA  11/22/2016, 11:53 AM

## 2016-11-22 NOTE — Discharge Summary (Signed)
Physician Discharge Summary Note  Patient:  Edward Grant is an 26 y.o., male MRN:  [id number] DOB:  23-May-1990 Patient phone:  [phone] (home)  Patient address:   [address] [place] [address],  Total Time spent with patient: Greater than 30 minutes  Date of Admission:  11/20/2016 Date of Discharge: 11-22-16  Reason for Admission:    Principal Problem: Alcohol use disorder, severe, dependence [facility])  Discharge Diagnoses: Patient Active Problem List   Diagnosis Date Noted  . Alcohol use disorder, severe, dependence (HCC) [F10.20] 11/21/2016  . Alcohol abuse [F10.10] 11/20/2016  . Major depressive disorder, recurrent severe without psychotic features (HCC) [F33.2] 11/20/2016  . ALLERGIC RHINITIS [J30.9] 07/05/2009  . GASTROENTERITIS WITHOUT DEHYDRATION [K52.89] 06/08/2009  . ANXIETY [F41.1] 08/17/2008  . CONJUNCTIVITIS, ALLERGIC [H10.45] 04/26/2006  . ASTHMA, INTERMITTENT [J45.909] 04/26/2006   Past Psychiatric History: Alcohol use disorder, dependence  Past Medical History:  Past Medical History:  Diagnosis Date  . Asthma    History reviewed. No pertinent surgical history. Family History: History reviewed. No pertinent family history.  Family Psychiatric  History: See H&P.  Social History:  History  Alcohol Use  . Yes     History  Drug Use No    Social History   Social History  . Marital status: Single    Spouse name: N/A  . Number of children: N/A  . Years of education: N/A   Social History Main Topics  . Smoking status: Former Games developer  . Smokeless tobacco: Never Used  . Alcohol use Yes  . Drug use: No  . Sexual activity: Not Asked   Other Topics Concern  . None   Social History Narrative  . None   Hospital Course: (Per Md SRA): Edward Grant is a 25 y.o Caucasian male, single, lives alone, employed. Personal and family history of addiction. Reports regular use of alcohol. Involuntarily committed by his mother. Reported to have  expressed suicidal thoughts while drinking at the bar. BAL at presentation was 170 mg/dl.  After admission assessment, Edward Grant was recommended for alcohol detoxification as well as mood stabilization treatments. He was started on the Ativan detox protocols for alcohol detox. However, this was later discontinued as he neither presented nor complained of any substance withdrawal symptoms. He was resumed on Fluoxetine 20 mg daily for depression. Apparently, Edward Grant was already receiving treatment for depression prior to coming to this hospital. He received & was discharged on Hydroxyzine 25 mg prn for anxiety. He presented no other significant health issues that required treatment. He was enrolled & participated briefly on the group counseling sessions being offered & held on this unit to learn coping skills that should help him cope better after discharge.  Edward Grant is seen today by the attending psychiatrist. He says he was never suicidal. That the tatement that said he was suicidal, was taken out of context. Says he has never had withdrawal symptoms in the past. He is not reporting any withdrawals. No sweatiness, no headaches. No retching, nausea or vomiting. No fullness in the head. No visual, tactile or auditory hallucination. No internal restlessness. No suicidal or homicidal thoughts. No thoughts of violence. Reports that he is in good spirits. Normal biological functions. He is able to think clearly. He is able to focus on task. His thoughts are not crowded or racing. No evidence of mania.  He is not making any delusional statement. No passivity of will/thought. He is fully in touch with reality.  No overwhelming anxiety. He wants  to get back to work and take care of his dad.  Staff reports that he has been appropriate on the unit. He has not voiced any futility thoughts. Not internally distracted. Vitals and CIWA scores has been normal. SW spoke with his mom and sister. Family agrees that he is not a  danger to himself or others. Family agrees with discharge plans. He will continue mental health care on outpatient basis as noted below. He is provided with all the necessary information needed to make this appointment without problems. Edward Grant [facility] with all personal belongings in no apparent distress. Transportation per his arrangement.   Physical Findings: AIMS: Facial and Oral Movements Muscles of Facial Expression: None, normal Lips and Perioral Area: None, normal Jaw: None, normal Tongue: None, normal,Extremity Movements Upper (arms, wrists, hands, fingers): None, normal Lower (legs, knees, ankles, toes): None, normal, Trunk Movements Neck, shoulders, hips: None, normal, Overall Severity Severity of abnormal movements (highest score from questions above): None, normal Incapacitation due to abnormal movements: None, normal Patient's awareness of abnormal movements (rate only patient's report): No Awareness, Dental Status Current problems with teeth and/or dentures?: No Does patient usually wear dentures?: No  CIWA:  CIWA-Ar Total: 1 COWS:     Musculoskeletal: Strength & Muscle Tone: within normal limits Gait & Station: normal Patient leans: N/A  Psychiatric Specialty Exam: Physical Exam  Constitutional: He is oriented to person, place, and time. He appears well-developed.  HENT:  Head: Normocephalic.  Eyes: Pupils are equal, round, and reactive to light.  Neck: Normal range of motion.  Cardiovascular: Normal rate.   Respiratory: Effort normal.  GI: Soft.  Genitourinary:  Genitourinary Comments: Deferred  Musculoskeletal: Normal range of motion.  Neurological: He is alert and oriented to person, place, and time.  Skin: Skin is warm and dry.    Review of Systems  Constitutional: Negative.   HENT: Negative.   Eyes: Negative.   Respiratory: Negative.   Cardiovascular: Negative.   Gastrointestinal: Negative.   Genitourinary: Negative.   Musculoskeletal: Negative.    Skin: Negative.   Neurological: Negative.   Endo/Heme/Allergies: Negative.   Psychiatric/Behavioral: Positive for depression (Stable) and substance abuse (Hx. alcohol use disorder). Negative for hallucinations, memory loss and suicidal ideas. The patient has insomnia (Stable). The patient is not nervous/anxious.     Blood pressure 137/89, pulse 99, temperature 98.4 F (36.9 C), temperature source Oral, resp. rate 16, height  (1.778 m), weight 100.7 kg (222 lb), SpO2 99 %.Body mass index is 31.85 kg/m.  See Md's SRA   Have you used any form of tobacco in the last 30 days? (Cigarettes, Smokeless Tobacco, Cigars, and/or Pipes): Yes  Has this patient used any form of tobacco in the last 30 days? (Cigarettes, Smokeless Tobacco, Cigars, and/or Pipes):  No  Blood Alcohol level:  Lab Results  Component Value Date   ETH 170 (H) 11/20/2016   Metabolic Disorder Labs:  No results found for: HGBA1C, MPG No results found for: PROLACTIN No results found for: CHOL, TRIG, HDL, CHOLHDL, VLDL, LDLCALC  See Psychiatric Specialty Exam and Suicide Risk Assessment completed by Attending Physician prior to discharge.  Discharge destination:  Home  Is patient on multiple antipsychotic therapies at discharge:  No   Has Patient had three or more failed trials of antipsychotic monotherapy by history:  No  Recommended Plan for Multiple Antipsychotic Therapies: NA  Allergies as of 11/22/2016   No Known Allergies     Medication List    STOP taking these  medications   ibuprofen 200 MG tablet Commonly known as:  ADVIL,MOTRIN     TAKE these medications     Indication  FLUoxetine 20 MG capsule Commonly known as:  PROZAC Take 3 capsules (60 mg total) by mouth daily. For depression What changed:  how much to take  additional instructions  Indication:  Major Depressive Disorder   hydrOXYzine 25 MG tablet Commonly known as:  ATARAX/VISTARIL Take 1 tablet (25 mg total) by mouth every 6 (six)  hours as needed for anxiety.  Indication:  Feeling Anxious      Follow-up Information    [facility] Follow up.   Specialty:  Behavioral Health Why:  Therapy appointment 10/15 at 11am w/ Brayton Caves. Please arrive no later than 10am to complete paperwork. Medication managment appointment 10/26 at 9am w/ Dr. Lolly Mustache. These were the first available appointments. Please call the office if you need to cancel. Contact information: [address] [place] [address] [phone]       [facility] Follow up.   Why:  Social worker has made a referral on your behalf. Please call the office upon discharge to schedule an appointment. If they have a therapy appointment available sooner than 10/15 please call [facility] to cancel your other appointment. Thank you. Contact information: [address] [place] [address] [phone]          Follow-up recommendations: Activity:  As tolerated Diet: As recommended by your primary care doctor. Keep all scheduled follow-up appointments as recommended.   Comments: Patient is instructed prior to discharge to: Take all medications as prescribed by his/her mental healthcare provider. Report any adverse effects and or reactions from the medicines to his/her outpatient provider promptly. Patient has been instructed & cautioned: To not engage in alcohol and or illegal drug use while on prescription medicines. In the event of worsening symptoms, patient is instructed to call the crisis hotline, 911 and or go to the nearest ED for appropriate evaluation and treatment of symptoms. To follow-up with his/her primary care provider for your other medical issues, concerns and or health care needs.   Signed: Sanjuana Kava, NP, PMHNP, FNP-BC 11/22/2016, 3:12 PM

## 2016-11-22 NOTE — BHH Suicide Risk Assessment (Signed)
[facility] Discharge Suicide Risk Assessment   Principal Problem: Alcohol use disorder, severe, dependence (HCC) Discharge Diagnoses:  Patient Active Problem List   Diagnosis Date Noted  . Alcohol use disorder, severe, dependence (HCC) [F10.20] 11/21/2016  . Alcohol abuse [F10.10] 11/20/2016  . Major depressive disorder, recurrent severe without psychotic features (HCC) [F33.2] 11/20/2016  . ALLERGIC RHINITIS [J30.9] 07/05/2009  . GASTROENTERITIS WITHOUT DEHYDRATION [K52.89] 06/08/2009  . ANXIETY [F41.1] 08/17/2008  . CONJUNCTIVITIS, ALLERGIC [H10.45] 04/26/2006  . ASTHMA, INTERMITTENT [J45.909] 04/26/2006    Total Time spent with patient: 30 minutes  Musculoskeletal: Strength & Muscle Tone: within normal limits Gait & Station: normal Patient leans: N/A  Psychiatric Specialty Exam: Review of Systems  Constitutional: Negative.   HENT: Negative.   Eyes: Negative.   Respiratory: Negative.   Cardiovascular: Negative.   Gastrointestinal: Negative.   Genitourinary: Negative.   Musculoskeletal: Negative.   Skin: Negative.   Neurological: Negative.   Endo/Heme/Allergies: Negative.   Psychiatric/Behavioral: Negative for depression, hallucinations, memory loss and suicidal ideas. The patient is not nervous/anxious and does not have insomnia.     Blood pressure 130/78, pulse 76, temperature 98.4 F (36.9 C), temperature source Oral, resp. rate 16, height  (1.778 m), weight 100.7 kg (222 lb), SpO2 99 %.Body mass index is 31.85 kg/m.  General Appearance: Neatly dressed, pleasant, engaging well and cooperative. Appropriate behavior. Not in any distress. Good relatedness. Not internally stimulated. No tremors, not sweaty. Normal conjugate eye movement.  Eye Contact::  Good  Speech:  Spontaneous, normal prosody. Normal tone and rate.   Volume:  Normal  Mood:  Euthymic  Affect:  Appropriate and Full Range  Thought Process:  Goal Directed and Linear  Orientation:  Full (Time, Place, and  Person)  Thought Content:  No delusional theme. No preoccupation with violent thoughts. No negative ruminations. No obsession.  No hallucination in any modality.   Suicidal Thoughts:  No  Homicidal Thoughts:  No  Memory:  Immediate;   Good Recent;   Good Remote;   Good  Judgement:  Good  Insight:  Partial as he is aware of the effects of alcohol on his mental health. Still processing relapse preventive measures.   Psychomotor Activity:  Normal  Concentration:  Good  Recall:  Good  Fund of Knowledge:Good  Language: Good  Akathisia:  Negative  Handed:    AIMS (if indicated):     Assets:  Communication Skills Desire for Improvement Financial Resources/Insurance Housing Physical Health Resilience Social Support Talents/Skills Vocational/Educational  Sleep:  Number of Hours: 5.75  Cognition: WNL  ADL's:  Intact   Clinical  Assessment::   26 y.o Caucasian male, single, lives alone, employed. Personal and family history of addiction. Reports regular use of alcohol. Involuntarily committed by his mother. Reported to have expressed suicidal thoughts while drinking at the bar. BAL at presentation was 170 mg/dl.  Seen today. Says he was never suicidal. Statement was taken out of context. Says he has never had withdrawal symptoms in the past. He is not reporting any withdrawals. No sweatiness, no headaches. No retching, nausea or vomiting. No fullness in the head. No visual, tactile or auditory hallucination. No internal restlessness. No suicidal or homicidal thoughts. No thoughts of violence. Reports that he is in good spirits. Normal biological functions. He is able to think clearly. He is able to focus on task. His thoughts are not crowded or racing. No evidence of mania.  He is not making any delusional statement. No passivity of will/thought. He is  fully in touch with reality.  No overwhelming anxiety. He wants to get back to work and take care of his dad.  Staff reports that he has been  appropriate. He has not voiced any futility thoughts. Not internally distracted. Vitals and CIWA scores has been normal. SW spoke with his mom and sister. Family agrees that he is not a danger to himself or others. Family agrees with discharge plans.    Demographic Factors:  NA  Loss Factors: NA  Historical Factors: Impulsivity  Risk Reduction Factors:   Sense of responsibility to family, Employed, Living with another person, especially a relative, Positive social support, Positive therapeutic relationship and Positive coping skills or problem solving skills  Continued Clinical Symptoms:  As above   Cognitive Features That Contribute To Risk:  None    Suicide Risk:  Minimal: No identifiable suicidal ideation.  Patient is not having any thoughts of suicide at this time. Modifiable risk factors targeted during this admission includes  substance use. Demographical and historical risk factors cannot be modified. Patient is now engaging well. Patient is reliable and is future oriented. We have buffered patient's support structures. At this point, patient is at low risk of suicide. Patient is aware of the effects of psychoactive substances on decision making process. Patient has been provided with emergency contacts. Patient acknowledges to use resources provided if unforseen circumstances changes their current risk stratification.     Plan Of Care/Follow-up recommendations:  1. Continue current psychotropic medications 2. Mental health and addiction follow up as arranged.  3. Discharge in care of his family 4. Provided limited quantity of prescriptions   Georgiann Cocker, MD 11/22/2016, 12:01 PM

## 2016-11-22 NOTE — Tx Team (Signed)
Interdisciplinary Treatment and Diagnostic Plan Update 11/22/2016 Time of Session: 9:30am  Edward Grant  MRN: [id number]  Principal Diagnosis: Alcohol use disorder, severe, dependence (HCC)  Secondary Diagnoses: Principal Problem:   Alcohol use disorder, severe, dependence (HCC) Active Problems:   Major depressive disorder, recurrent severe without psychotic features (HCC)   Current Medications:  Current Facility-Administered Medications  Medication Dose Route Frequency Provider Last Rate Last Dose  . acetaminophen (TYLENOL) tablet 650 mg  650 mg Oral Q6H PRN Charm Rings, NP   650 mg at 11/22/16 0853  . alum & mag hydroxide-simeth (MAALOX/MYLANTA) 200-200-20 MG/5ML suspension 30 mL  30 mL Oral Q4H PRN Charm Rings, NP      . FLUoxetine (PROZAC) capsule 60 mg  60 mg Oral Daily Beau Fanny, FNP   60 mg at 11/22/16 9528  . hydrOXYzine (ATARAX/VISTARIL) tablet 25 mg  25 mg Oral Q6H PRN Beau Fanny, FNP   25 mg at 11/21/16 2109  . loperamide (IMODIUM) capsule 2-4 mg  2-4 mg Oral PRN Withrow, Everardo All, FNP      . LORazepam (ATIVAN) tablet 1 mg  1 mg Oral Q6H PRN Withrow, John C, FNP      . magnesium hydroxide (MILK OF MAGNESIA) suspension 30 mL  30 mL Oral Daily PRN Charm Rings, NP      . multivitamin with minerals tablet 1 tablet  1 tablet Oral Daily Beau Fanny, FNP   1 tablet at 11/22/16 [phone]  . ondansetron (ZOFRAN-ODT) disintegrating tablet 4 mg  4 mg Oral Q6H PRN Withrow, John C, FNP      . thiamine (VITAMIN B-1) tablet 100 mg  100 mg Oral Daily Beau Fanny, FNP   100 mg at 11/22/16 4401    PTA Medications: Prescriptions Prior to Admission  Medication Sig Dispense Refill Last Dose  . FLUoxetine (PROZAC) 20 MG capsule Take 20 mg by mouth daily.   11/19/2016 at 0900  . ibuprofen (ADVIL,MOTRIN) 200 MG tablet Take 600 mg by mouth every 6 (six) hours as needed for fever, headache, mild pain, moderate pain or cramping.   11/19/2016 at Unknown time    Treatment  Modalities: Medication Management, Group therapy, Case management,  1 to 1 session with clinician, Psychoeducation, Recreational therapy.  Patient Stressors: Financial difficulties Marital or family conflict Substance abuse Patient Strengths: Ability for insight Average or above average Radio producer for treatment/growth Physical Health Supportive family/friends  Physician Treatment Plan for Primary Diagnosis: Alcohol use disorder, severe, dependence (HCC) Long Term Goal(s): Improvement in symptoms so as ready for discharge Short Term Goals: Ability to identify changes in lifestyle to reduce recurrence of condition will improve Ability to verbalize feelings will improve Ability to disclose and discuss suicidal ideas Ability to demonstrate self-control will improve Ability to identify and develop effective coping behaviors will improve Ability to maintain clinical measurements within normal limits will improve Compliance with prescribed medications will improve Ability to identify triggers associated with substance abuse/mental health issues will improve Ability to identify changes in lifestyle to reduce recurrence of condition will improve Ability to verbalize feelings will improve Ability to disclose and discuss suicidal ideas Ability to demonstrate self-control will improve Ability to identify and develop effective coping behaviors will improve Ability to maintain clinical measurements within normal limits will improve Compliance with prescribed medications will improve Ability to identify triggers associated with substance abuse/mental health issues will improve  Medication Management: Evaluate patient's response, side effects, and tolerance of  medication regimen.  Therapeutic Interventions: 1 to 1 sessions, Unit Group sessions and Medication administration.  Evaluation of Outcomes: Progressing  Physician Treatment Plan for Secondary Diagnosis:  Principal Problem:   Alcohol use disorder, severe, dependence (HCC) Active Problems:   Major depressive disorder, recurrent severe without psychotic features (HCC)  Long Term Goal(s): Improvement in symptoms so as ready for discharge  Short Term Goals: Ability to identify changes in lifestyle to reduce recurrence of condition will improve Ability to verbalize feelings will improve Ability to disclose and discuss suicidal ideas Ability to demonstrate self-control will improve Ability to identify and develop effective coping behaviors will improve Ability to maintain clinical measurements within normal limits will improve Compliance with prescribed medications will improve Ability to identify triggers associated with substance abuse/mental health issues will improve Ability to identify changes in lifestyle to reduce recurrence of condition will improve Ability to verbalize feelings will improve Ability to disclose and discuss suicidal ideas Ability to demonstrate self-control will improve Ability to identify and develop effective coping behaviors will improve Ability to maintain clinical measurements within normal limits will improve Compliance with prescribed medications will improve Ability to identify triggers associated with substance abuse/mental health issues will improve  Medication Management: Evaluate patient's response, side effects, and tolerance of medication regimen.  Therapeutic Interventions: 1 to 1 sessions, Unit Group sessions and Medication administration.  Evaluation of Outcomes: Progressing  RN Treatment Plan for Primary Diagnosis: Alcohol use disorder, severe, dependence (HCC) Long Term Goal(s): Knowledge of disease and therapeutic regimen to maintain health will improve  Short Term Goals: Ability to verbalize feelings will improve, Ability to identify and develop effective coping behaviors will improve and Compliance with prescribed medications will  improve  Medication Management: RN will administer medications as ordered by provider, will assess and evaluate patient's response and provide education to patient for prescribed medication. RN will report any adverse and/or side effects to prescribing provider.  Therapeutic Interventions: 1 on 1 counseling sessions, Psychoeducation, Medication administration, Evaluate responses to treatment, Monitor vital signs and CBGs as ordered, Perform/monitor CIWA, COWS, AIMS and Fall Risk screenings as ordered, Perform wound care treatments as ordered.  Evaluation of Outcomes: Progressing  LCSW Treatment Plan for Primary Diagnosis: Alcohol use disorder, severe, dependence (HCC) Long Term Goal(s): Safe transition to appropriate next level of care at discharge, Engage patient in therapeutic group addressing interpersonal concerns. Short Term Goals: Engage patient in aftercare planning with referrals and resources, Facilitate patient progression through stages of change regarding substance use diagnoses and concerns, Identify triggers associated with mental health/substance abuse issues and Increase skills for wellness and recovery  Therapeutic Interventions: Assess for all discharge needs, 1 to 1 time with Social worker, Explore available resources and support systems, Assess for adequacy in community support network, Educate family and significant other(s) on suicide prevention, Complete Psychosocial Assessment, Interpersonal group therapy.  Evaluation of Outcomes: Progressing  Progress in Treatment: Attending groups: No Participating in groups: No Taking medication as prescribed: Yes, MD continues to assess for medication changes as needed Toleration medication: Yes, no side effects reported at this time Family/Significant other contact made: No, CSW attempting contact with pt's sister. Patient understands diagnosis: Developing insight  Discussing patient identified problems/goals with staff:  Yes Medical problems stabilized or resolved: Yes Denies suicidal/homicidal ideation: Yes  Issues/concerns per patient self-inventory: None Other: N/A  New problem(s) identified: None identified at this time.   New Short Term/Long Term Goal(s): "To get out of here as soon as possible"  Discharge Plan or  Barriers: Pt will return home and follow up with an outpatient provider.  Reason for Continuation of Hospitalization:  None identified at this time.  Estimated Length of Stay: 0 days; Pt will likely discharge today 11/22/16  Attendees: Patient: Edward Grant 11/22/2016 9:04 AM  Physician: Dr. Jackquline Berlin 11/22/2016 9:04 AM  Nursing: Rodell Perna, RN 11/22/2016 9:04 AM  RN Care Manager: 11/22/2016 9:04 AM  Social Worker: Donnelly Stager, LCSWA 11/22/2016 9:04 AM  Recreational Therapist:  11/22/2016 9:04 AM  Other:  11/22/2016 9:04 AM  Other:  11/22/2016 9:04 AM  Other: 11/22/2016 9:04 AM  Scribe for Treatment Team: Jonathon Jordan, MSW,LCSWA 11/22/2016 9:04 AM

## 2016-11-22 NOTE — Progress Notes (Signed)
Discharge note: Pt received both written and verbal discharge instructions. Pt verbalized understanding of d/c instructions. Pt agreed to f/u appt and med regimen. Pt received SRA, AVS, transitional record and prescriptions. Pt safely discharged to the lobby.

## 2016-11-27 DIAGNOSIS — F419 Anxiety disorder, unspecified: Secondary | ICD-10-CM | POA: Diagnosis not present

## 2016-11-27 DIAGNOSIS — S63044A Dislocation of carpometacarpal joint of right thumb, initial encounter: Secondary | ICD-10-CM | POA: Diagnosis not present

## 2016-11-27 DIAGNOSIS — F1010 Alcohol abuse, uncomplicated: Secondary | ICD-10-CM | POA: Diagnosis not present

## 2016-11-27 DIAGNOSIS — S63054A Dislocation of other carpometacarpal joint of right hand, initial encounter: Secondary | ICD-10-CM | POA: Diagnosis not present

## 2016-11-27 DIAGNOSIS — F329 Major depressive disorder, single episode, unspecified: Secondary | ICD-10-CM | POA: Diagnosis not present

## 2016-11-27 DIAGNOSIS — S63104A Unspecified dislocation of right thumb, initial encounter: Secondary | ICD-10-CM | POA: Diagnosis not present

## 2016-11-27 DIAGNOSIS — R2242 Localized swelling, mass and lump, left lower limb: Secondary | ICD-10-CM | POA: Diagnosis not present

## 2016-11-27 DIAGNOSIS — Z041 Encounter for examination and observation following transport accident: Secondary | ICD-10-CM | POA: Diagnosis not present

## 2016-11-27 DIAGNOSIS — S0101XA Laceration without foreign body of scalp, initial encounter: Secondary | ICD-10-CM | POA: Diagnosis not present

## 2016-11-27 DIAGNOSIS — S0181XA Laceration without foreign body of other part of head, initial encounter: Secondary | ICD-10-CM | POA: Diagnosis not present

## 2016-12-11 ENCOUNTER — Ambulatory Visit (HOSPITAL_COMMUNITY): Payer: Self-pay | Admitting: Licensed Clinical Social Worker

## 2016-12-13 DIAGNOSIS — F410 Panic disorder [episodic paroxysmal anxiety] without agoraphobia: Secondary | ICD-10-CM | POA: Diagnosis not present

## 2016-12-13 DIAGNOSIS — F411 Generalized anxiety disorder: Secondary | ICD-10-CM | POA: Diagnosis not present

## 2016-12-13 DIAGNOSIS — R748 Abnormal levels of other serum enzymes: Secondary | ICD-10-CM | POA: Diagnosis not present

## 2016-12-13 DIAGNOSIS — F329 Major depressive disorder, single episode, unspecified: Secondary | ICD-10-CM | POA: Diagnosis not present

## 2016-12-22 ENCOUNTER — Ambulatory Visit (HOSPITAL_COMMUNITY): Payer: Self-pay | Admitting: Psychiatry

## 2016-12-22 DIAGNOSIS — X58XXXD Exposure to other specified factors, subsequent encounter: Secondary | ICD-10-CM | POA: Diagnosis not present

## 2016-12-22 DIAGNOSIS — S63054A Dislocation of other carpometacarpal joint of right hand, initial encounter: Secondary | ICD-10-CM | POA: Diagnosis not present

## 2016-12-22 DIAGNOSIS — S63054D Dislocation of other carpometacarpal joint of right hand, subsequent encounter: Secondary | ICD-10-CM | POA: Diagnosis not present

## 2016-12-22 DIAGNOSIS — Z4802 Encounter for removal of sutures: Secondary | ICD-10-CM | POA: Diagnosis not present

## 2019-01-09 DIAGNOSIS — F410 Panic disorder [episodic paroxysmal anxiety] without agoraphobia: Secondary | ICD-10-CM | POA: Diagnosis not present

## 2019-01-09 DIAGNOSIS — F411 Generalized anxiety disorder: Secondary | ICD-10-CM | POA: Diagnosis not present

## 2019-02-25 DIAGNOSIS — Z20828 Contact with and (suspected) exposure to other viral communicable diseases: Secondary | ICD-10-CM | POA: Diagnosis not present

## 2019-02-25 DIAGNOSIS — J209 Acute bronchitis, unspecified: Secondary | ICD-10-CM | POA: Diagnosis not present

## 2019-06-17 DIAGNOSIS — F419 Anxiety disorder, unspecified: Secondary | ICD-10-CM | POA: Diagnosis not present

## 2019-07-07 DIAGNOSIS — Z20828 Contact with and (suspected) exposure to other viral communicable diseases: Secondary | ICD-10-CM | POA: Diagnosis not present

## 2020-05-28 ENCOUNTER — Other Ambulatory Visit: Payer: Self-pay

## 2020-05-28 ENCOUNTER — Encounter (HOSPITAL_COMMUNITY)
Admission: EM | Disposition: A | Discharge status: Home / Self Care | Payer: Self-pay | Source: Home / Self Care | Attending: Emergency Medicine

## 2020-05-28 ENCOUNTER — Emergency Department (HOSPITAL_COMMUNITY): Payer: BC Managed Care – PPO | Admitting: Anesthesiology

## 2020-05-28 ENCOUNTER — Ambulatory Visit (HOSPITAL_COMMUNITY)
Admission: EM | Admit: 2020-05-28 | Discharge: 2020-05-28 | Disposition: A | Discharge status: Home / Self Care | Payer: BC Managed Care – PPO | Attending: Gastroenterology | Admitting: Gastroenterology

## 2020-05-28 ENCOUNTER — Encounter (HOSPITAL_COMMUNITY): Payer: Self-pay | Admitting: Certified Registered Nurse Anesthetist

## 2020-05-28 DIAGNOSIS — X58XXXA Exposure to other specified factors, initial encounter: Secondary | ICD-10-CM | POA: Diagnosis not present

## 2020-05-28 DIAGNOSIS — Z87891 Personal history of nicotine dependence: Secondary | ICD-10-CM | POA: Diagnosis not present

## 2020-05-28 DIAGNOSIS — Z20822 Contact with and (suspected) exposure to covid-19: Secondary | ICD-10-CM | POA: Diagnosis not present

## 2020-05-28 DIAGNOSIS — K2970 Gastritis, unspecified, without bleeding: Secondary | ICD-10-CM | POA: Insufficient documentation

## 2020-05-28 DIAGNOSIS — K219 Gastro-esophageal reflux disease without esophagitis: Secondary | ICD-10-CM | POA: Insufficient documentation

## 2020-05-28 DIAGNOSIS — K222 Esophageal obstruction: Secondary | ICD-10-CM | POA: Diagnosis not present

## 2020-05-28 DIAGNOSIS — T18128A Food in esophagus causing other injury, initial encounter: Secondary | ICD-10-CM | POA: Diagnosis not present

## 2020-05-28 DIAGNOSIS — Z79899 Other long term (current) drug therapy: Secondary | ICD-10-CM | POA: Insufficient documentation

## 2020-05-28 DIAGNOSIS — K209 Esophagitis, unspecified without bleeding: Secondary | ICD-10-CM | POA: Insufficient documentation

## 2020-05-28 DIAGNOSIS — J4520 Mild intermittent asthma, uncomplicated: Secondary | ICD-10-CM | POA: Diagnosis not present

## 2020-05-28 HISTORY — PX: ESOPHAGOGASTRODUODENOSCOPY (EGD) WITH PROPOFOL: SHX5813

## 2020-05-28 HISTORY — PX: FOREIGN BODY REMOVAL: SHX962

## 2020-05-28 LAB — COMPREHENSIVE METABOLIC PANEL
ALT: 195 U/L — ABNORMAL HIGH (ref 0–44)
AST: 163 U/L — ABNORMAL HIGH (ref 15–41)
Albumin: 4.9 g/dL (ref 3.5–5.0)
Alkaline Phosphatase: 44 U/L (ref 38–126)
Anion gap: 10 (ref 5–15)
BUN: 13 mg/dL (ref 6–20)
CO2: 24 mmol/L (ref 22–32)
Calcium: 9.4 mg/dL (ref 8.9–10.3)
Chloride: 107 mmol/L (ref 98–111)
Creatinine, Ser: 0.81 mg/dL (ref 0.61–1.24)
GFR, Estimated: >60 mL/min (ref >60)
Glucose, Bld: 85 mg/dL (ref 70–99)
Potassium: 4 mmol/L (ref 3.5–5.1)
Sodium: 141 mmol/L (ref 135–145)
Total Bilirubin: 1.8 mg/dL — ABNORMAL HIGH (ref 0.3–1.2)
Total Protein: 7.9 g/dL (ref 6.5–8.1)

## 2020-05-28 LAB — CBC WITH DIFFERENTIAL/PLATELET
Abs Immature Granulocytes: 0.04 10*3/uL (ref 0.00–0.07)
Basophils Absolute: 0.1 10*3/uL (ref 0.0–0.1)
Basophils Relative: 1 %
Eosinophils Absolute: 0.4 10*3/uL (ref 0.0–0.5)
Eosinophils Relative: 4 %
HCT: 50 % (ref 39.0–52.0)
Hemoglobin: 16.8 g/dL (ref 13.0–17.0)
Immature Granulocytes: 0 %
Lymphocytes Relative: 12 %
Lymphs Abs: 1.3 10*3/uL (ref 0.7–4.0)
MCH: 33.6 pg (ref 26.0–34.0)
MCHC: 33.6 g/dL (ref 30.0–36.0)
MCV: 100 fL (ref 80.0–100.0)
Monocytes Absolute: 1 10*3/uL (ref 0.1–1.0)
Monocytes Relative: 10 %
Neutro Abs: 7.4 10*3/uL (ref 1.7–7.7)
Neutrophils Relative %: 73 %
Platelets: 269 10*3/uL (ref 150–400)
RBC: 5 MIL/uL (ref 4.22–5.81)
RDW: 12 % (ref 11.5–15.5)
WBC: 10.2 10*3/uL (ref 4.0–10.5)
nRBC: 0 % (ref 0.0–0.2)

## 2020-05-28 LAB — RESP PANEL BY RT-PCR (FLU A&B, COVID) ARPGX2
Influenza A by PCR: NEGATIVE
Influenza B by PCR: NEGATIVE
SARS Coronavirus 2 by RT PCR: NEGATIVE

## 2020-05-28 SURGERY — ESOPHAGOGASTRODUODENOSCOPY (EGD) WITH PROPOFOL
Anesthesia: Monitor Anesthesia Care

## 2020-05-28 MED ORDER — LORAZEPAM 2 MG/ML IJ SOLN
1.0000 mg | Freq: Once | INTRAMUSCULAR | Status: AC
  Administered 2020-05-28: 1 mg via INTRAVENOUS
  Filled 2020-05-28: qty 1

## 2020-05-28 MED ORDER — DEXMEDETOMIDINE (PRECEDEX) IN NS 20 MCG/5ML (4 MCG/ML) IV SYRINGE
PREFILLED_SYRINGE | INTRAVENOUS | Status: DC | PRN
  Administered 2020-05-28: 8 ug via INTRAVENOUS
  Administered 2020-05-28: 12 ug via INTRAVENOUS

## 2020-05-28 MED ORDER — PROPOFOL 10 MG/ML IV BOLUS
INTRAVENOUS | Status: DC | PRN
  Administered 2020-05-28: 50 mg via INTRAVENOUS
  Administered 2020-05-28: 20 mg via INTRAVENOUS
  Administered 2020-05-28: 50 mg via INTRAVENOUS

## 2020-05-28 MED ORDER — GLUCAGON HCL RDNA (DIAGNOSTIC) 1 MG IJ SOLR
1.0000 mg | Freq: Once | INTRAMUSCULAR | Status: AC
  Administered 2020-05-28: 1 mg via INTRAVENOUS
  Filled 2020-05-28: qty 1

## 2020-05-28 MED ORDER — LIDOCAINE 2% (20 MG/ML) 5 ML SYRINGE
INTRAMUSCULAR | Status: DC | PRN
  Administered 2020-05-28: 60 mg via INTRAVENOUS

## 2020-05-28 MED ORDER — PROPOFOL 500 MG/50ML IV EMUL
INTRAVENOUS | Status: DC | PRN
  Administered 2020-05-28: 100 ug/kg/min via INTRAVENOUS

## 2020-05-28 MED ORDER — LACTATED RINGERS IV SOLN: INTRAVENOUS | Status: DC | PRN

## 2020-05-28 MED ORDER — OMEPRAZOLE MAGNESIUM 20 MG PO TBEC: 20.0000 mg | DELAYED_RELEASE_TABLET | Freq: Two times a day (BID) | ORAL | 12 refills | Status: AC

## 2020-05-28 SURGICAL SUPPLY — 14 items

## 2020-05-28 NOTE — Anesthesia Procedure Notes (Signed)
Procedure Name: MAC Date/Time: 05/28/2020 3:53 PM Performed by: Alain Marion, CRNA Pre-anesthesia Checklist: Patient identified, Emergency Drugs available, Suction available and Patient being monitored Oxygen Delivery Method: Nasal cannula Placement Confirmation: positive ETCO2

## 2020-05-28 NOTE — Discharge Instructions (Signed)
YOU HAD AN ENDOSCOPIC PROCEDURE TODAY: Refer to the procedure report and other information in the discharge instructions given to you for any specific questions about what was found during the examination. If this information does not answer your questions, please call [place] office at [phone] to clarify.   YOU SHOULD EXPECT: Some feelings of bloating in the abdomen. Passage of more gas than usual. Walking can help get rid of the air that was put into your GI tract during the procedure and reduce the bloating. If you had a lower endoscopy (such as a colonoscopy or flexible sigmoidoscopy) you may notice spotting of blood in your stool or on the toilet paper. Some abdominal soreness may be present for a day or two, also.  DIET: Your first meal following the procedure should be a light meal and then it is ok to progress to your normal diet. A half-sandwich or bowl of soup is an example of a good first meal. Heavy or fried foods are harder to digest and may make you feel nauseous or bloated. Drink plenty of fluids but you should avoid alcoholic beverages for 24 hours. If you had a esophageal dilation, please see attached instructions for diet.    ACTIVITY: Your care partner should take you home directly after the procedure. You should plan to take it easy, moving slowly for the rest of the day. You can resume normal activity the day after the procedure however YOU SHOULD NOT DRIVE, use power tools, machinery or perform tasks that involve climbing or major physical exertion for 24 hours (because of the sedation medicines used during the test).   SYMPTOMS TO REPORT IMMEDIATELY: A gastroenterologist can be reached at any hour. Please call [phone]  for any of the following symptoms:  . Following upper endoscopy (EGD, EUS, ERCP, esophageal dilation) Vomiting of blood or coffee ground material  New, significant abdominal pain  New, significant chest pain or pain under the shoulder blades  Painful or  persistently difficult swallowing  New shortness of breath  Black, tarry-looking or red, bloody stools  FOLLOW UP:  If any biopsies were taken you will be contacted by phone or by letter within the next 1-3 weeks. Call [phone]  if you have not heard about the biopsies in 3 weeks.  Please also call with any specific questions about appointments or follow up tests.YOU HAD AN ENDOSCOPIC PROCEDURE TODAY: Refer to the procedure report and other information in the discharge instructions given to you for any specific questions about what was found during the examination. If this information does not answer your questions, please call [place] office at [phone] to clarify.   YOU SHOULD EXPECT: Some feelings of bloating in the abdomen. Passage of more gas than usual. Walking can help get rid of the air that was put into your GI tract during the procedure and reduce the bloating. If you had a lower endoscopy (such as a colonoscopy or flexible sigmoidoscopy) you may notice spotting of blood in your stool or on the toilet paper. Some abdominal soreness may be present for a day or two, also.  DIET: Your first meal following the procedure should be a light meal and then it is ok to progress to your normal diet. A half-sandwich or bowl of soup is an example of a good first meal. Heavy or fried foods are harder to digest and may make you feel nauseous or bloated. Drink plenty of fluids but you should avoid alcoholic beverages for 24 hours. If you had   a esophageal dilation, please see attached instructions for diet.    ACTIVITY: Your care partner should take you home directly after the procedure. You should plan to take it easy, moving slowly for the rest of the day. You can resume normal activity the day after the procedure however YOU SHOULD NOT DRIVE, use power tools, machinery or perform tasks that involve climbing or major physical exertion for 24 hours (because of the sedation medicines used during the  test).   SYMPTOMS TO REPORT IMMEDIATELY: A gastroenterologist can be reached at any hour. Please call [phone]  for any of the following symptoms:   . Following upper endoscopy (EGD, EUS, ERCP, esophageal dilation) Vomiting of blood or coffee ground material  New, significant abdominal pain  New, significant chest pain or pain under the shoulder blades  Painful or persistently difficult swallowing  New shortness of breath  Black, tarry-looking or red, bloody stools  FOLLOW UP:  If any biopsies were taken you will be contacted by phone or by letter within the next 1-3 weeks. Call [phone]  if you have not heard about the biopsies in 3 weeks.  Please also call with any specific questions about appointments or follow up tests. 

## 2020-05-28 NOTE — ED Triage Notes (Signed)
Pt reports he has a h/o getting food struck in his throat. Pt reports this time he is unable to get the food out of his throat. Pt reports nothing being able to eat or drink. Pt airway intact.Pt denies any sob.

## 2020-05-28 NOTE — ED Provider Notes (Signed)
[facility] EMERGENCY DEPARTMENT Provider Note   CSN: [id number] Arrival date & time: 05/28/20  1014     History Chief Complaint  Patient presents with  . Dysphagia    KALEP FULL is a 30 y.o. male with a history of anxiety presenting to the emergency department with concern for food stuck in his throat.  The patient reports he was eating pizza last night and may have taken too large of a bite of crust.  This occurred around 8 PM.  He feels like it is stuck in his esophagus.  This is happened to him before at home, but typically the bolus will pass after few hours of effort.  However the food has been stuck all night.  He states he is unable to drink as he feels a liquid gets stuck in his throat and then comes back up.  He is having a difficult time swallowing.  He has not seen a GI doctor for this in the past and does not have a gastroenterologist.  He does suffer from anxiety and takes Xanax regularly.  He reports no other significant medical problems.  He has not had any of the COVID vaccines.  He denies any recent fevers, chills, cough or congestion.  HPI     Past Medical History:  Diagnosis Date  . Asthma     Patient Active Problem List   Diagnosis Date Noted  . Esophageal obstruction due to food impaction   . Alcohol use disorder, severe, dependence (HCC) 11/21/2016  . Alcohol abuse 11/20/2016  . Major depressive disorder, recurrent severe without psychotic features (HCC) 11/20/2016  . ALLERGIC RHINITIS 07/05/2009  . GASTROENTERITIS WITHOUT DEHYDRATION 06/08/2009  . ANXIETY 08/17/2008  . CONJUNCTIVITIS, ALLERGIC 04/26/2006  . ASTHMA, INTERMITTENT 04/26/2006    History reviewed. No pertinent surgical history.     History reviewed. No pertinent family history.  Social History   Tobacco Use  . Smoking status: Former Games developer  . Smokeless tobacco: Never Used  Substance Use Topics  . Alcohol use: Yes  . Drug use: No    Home  Medications Prior to Admission medications   Medication Sig Start Date End Date Taking? Authorizing Provider  ALPRAZolam Prudy Feeler) 1 MG tablet Take 1 mg by mouth 2 (two) times daily as needed for anxiety. 04/30/20  Yes [provider]  omeprazole (PRILOSEC OTC) 20 MG tablet Take 1 tablet (20 mg total) by mouth 2 (two) times daily. 05/28/20 05/28/21 Yes Lynann Bologna, MD  FLUoxetine (PROZAC) 20 MG capsule Take 3 capsules (60 mg total) by mouth daily. For depression Patient not taking: No sig reported 11/23/16   Armandina Stammer I, NP  hydrOXYzine (ATARAX/VISTARIL) 25 MG tablet Take 1 tablet (25 mg total) by mouth every 6 (six) hours as needed for anxiety. Patient not taking: No sig reported 11/22/16   Sanjuana Kava, NP    Allergies    Patient has no known allergies.  Review of Systems   Review of Systems  Constitutional: Negative for chills and fever.  HENT: Positive for drooling, sore throat, trouble swallowing and voice change.   Eyes: Negative for pain and visual disturbance.  Respiratory: Negative for cough and shortness of breath.   Cardiovascular: Negative for chest pain and palpitations.  Gastrointestinal: Negative for abdominal pain and vomiting.  Skin: Negative for color change and rash.  Neurological: Negative for syncope and headaches.  All other systems reviewed and are negative.   Physical Exam Updated Vital Signs BP (!) 163/95  Pulse (!) 111   Temp (!) 97.5 F (36.4 C) (Temporal)   Resp 20   SpO2 97%   Physical Exam Constitutional:      General: He is not in acute distress. HENT:     Head: Normocephalic and atraumatic.     Mouth/Throat:     Comments: Voice mildly muffled, no drooling Mallempati 1 view of oropharynx, no visible bolus or foreign body in posterior oropharynx Eyes:     Conjunctiva/sclera: Conjunctivae normal.     Pupils: Pupils are equal, round, and reactive to light.  Cardiovascular:     Rate and Rhythm: Normal rate and regular rhythm.   Pulmonary:     Effort: Pulmonary effort is normal. No respiratory distress.  Abdominal:     General: There is no distension.     Tenderness: There is no abdominal tenderness.  Skin:    General: Skin is warm and dry.  Neurological:     General: No focal deficit present.     Mental Status: He is alert. Mental status is at baseline.  Psychiatric:        Mood and Affect: Mood normal.        Behavior: Behavior normal.     ED Results / Procedures / Treatments   Labs (all labs ordered are listed, but only abnormal results are displayed) Labs Reviewed  COMPREHENSIVE METABOLIC PANEL - Abnormal; Notable for the following components:      Result Value   AST 163 (*)    ALT 195 (*)    Total Bilirubin 1.8 (*)    All other components within normal limits  RESP PANEL BY RT-PCR (FLU A&B, COVID) ARPGX2  CBC WITH DIFFERENTIAL/PLATELET    EKG None  Radiology No results found.  Procedures Procedures   Medications Ordered in ED Medications  LORazepam (ATIVAN) injection 1 mg (1 mg Intravenous Given 05/28/20 1158)  glucagon (human recombinant) (GLUCAGEN) injection 1 mg (1 mg Intravenous Given 05/28/20 1158)    ED Course  I have reviewed the triage vital signs and the nursing notes.  Pertinent labs & imaging results that were available during my care of the patient were reviewed by me and considered in my medical decision making (see chart for details).  Food bolus suspected in esophagus, unable to drink or handle secretions since last night at 8pm  Does not appear toxic on exam  Plan to try IV ativan, IV glucagon  Consulted GI team as noted below - they will likely move towards Endoscopy today if bolus does not pass.    Clinical Course as of 05/28/20 1738  Fri May 28, 2020  1153 Paged [facility] for unassigned [MT]  1156 I spoke to Lianne Bushy from GI who does not request any immediate labs, agrees with attempted IV medications, and she will speak to [facility] attending regarding  possible endo [MT]    Clinical Course User Index [MT] Valentine Barney, Kermit Balo, MD    Final Clinical Impression(s) / ED Diagnoses Final diagnoses:  Food impaction of esophagus, initial encounter    Rx / DC Orders ED Discharge Orders         Ordered    omeprazole (PRILOSEC OTC) 20 MG tablet  2 times daily        05/28/20 1706           Terald Sleeper, MD 05/28/20 1738

## 2020-05-28 NOTE — Interval H&P Note (Signed)
History and Physical Interval Note:  05/28/2020 3:45 PM  Edward Grant  has presented today for surgery, with the diagnosis of Food impaction since 8 PM last night.  The various methods of treatment have been discussed with the patient and family. After consideration of risks, benefits and other options for treatment, the patient has consented to  Procedure(s): ESOPHAGOGASTRODUODENOSCOPY (EGD) WITH PROPOFOL (N/A) as a surgical intervention.  The patient's history has been reviewed, patient examined, no change in status, stable for surgery.  I have reviewed the patient's chart and labs.  Questions were answered to the patient's satisfaction.     Lynann Bologna

## 2020-05-28 NOTE — Anesthesia Preprocedure Evaluation (Addendum)
Anesthesia Evaluation  Patient identified by MRN, date of birth, ID band Patient awake    Reviewed: Allergy & Precautions, NPO status , Patient's Chart, lab work & pertinent test results  Airway Mallampati: I  TM Distance: >3 FB Neck ROM: Full    Dental  (+) Dental Advisory Given, Teeth Intact   Pulmonary asthma (hasnt used inhaler in months) , former smoker,    Pulmonary exam normal breath sounds clear to auscultation       Cardiovascular negative cardio ROS Normal cardiovascular exam Rhythm:Regular Rate:Normal     Neuro/Psych PSYCHIATRIC DISORDERS Anxiety Depression negative neurological ROS     GI/Hepatic (+)     substance abuse (last EtOH last night)  alcohol use, Elevated LFTs Denies any drug use  Food impaction   Endo/Other  negative endocrine ROS  Renal/GU negative Renal ROS  negative genitourinary   Musculoskeletal negative musculoskeletal ROS (+)   Abdominal   Peds  Hematology hct 50, plt 269   Anesthesia Other Findings   Reproductive/Obstetrics negative OB ROS                           Anesthesia Physical Anesthesia Plan  ASA: II  Anesthesia Plan: MAC   Post-op Pain Management:    Induction:   PONV Risk Score and Plan: 2 and Propofol infusion and TIVA  Airway Management Planned: Natural Airway and Simple Face Mask  Additional Equipment: None  Intra-op Plan:   Post-operative Plan:   Informed Consent: I have reviewed the patients History and Physical, chart, labs and discussed the procedure including the risks, benefits and alternatives for the proposed anesthesia with the patient or authorized representative who has indicated his/her understanding and acceptance.       Plan Discussed with: CRNA  Anesthesia Plan Comments: (Sister to pick pt up at 6pm)       Anesthesia Quick Evaluation

## 2020-05-28 NOTE — H&P (Signed)
     Progress Note    ASSESSMENT AND PLAN:   Acute esophageal obstruction d/t FB GERD EtOH abuse with abnormal LFTs  Plan: -Emergent EGD.  I discussed risks and benefits including risks of perforation requiring thoracotomy/prolonged hospitalization, bleeding, aspiration.  Benefits were also discussed.  He wishes to proceed. -I also instructed him to stop drinking alcohol.  He does not wish me to talk to his sister regarding that.  He does not want any liver work-up while he is here.  He has assured me that he would follow-up as an outpatient.  Copy of the labs were given to the patient.     SUBJECTIVE   30 year old With sudden onset of dysphagia after eating pizza last night at 8 PM.  He did tell me he was drinking alcohol as well.  After that he could not swallow.  He came into the emergency room this morning.  Has not been able to handle his secretions.  He has been having occasional heartburn.  Has been having intermittent dysphagia mostly in the neck area for last 4 to 5 years.  He was found to have abnormal LFTs as before.  No fever chills or night sweats.  No shortness of breath wheezing or cough.    OBJECTIVE:     Vital signs in last 24 hours: Temp:  [97.6 F (36.4 C)] 97.6 F (36.4 C) (04/01 1017) Pulse Rate:  [103-125] 122 (04/01 1520) Resp:  [16-25] 25 (04/01 1520) BP: (132-164)/(84-118) 153/100 (04/01 1520) SpO2:  [93 %-98 %] 97 % (04/01 1520)   General:   Alert, well-developed male in NAD EENT:  Normal hearing, non icteric sclera, conjunctive pink.  Heart:  Regular rate and rhythm; no murmur.  No lower extremity edema   Pulm: Normal respiratory effort, lungs CTA bilaterally without wheezes or crackles. Abdomen:  Soft, nondistended, nontender.  Normal bowel sounds,.  Liver is palpated 4 cm below the costal margin.  Nontender.     Neurologic:  Alert and  oriented x4;  grossly normal neurologically. Psych:  Pleasant, cooperative.  Normal mood and  affect.   Intake/Output from previous day: No intake/output data recorded. Intake/Output this shift: No intake/output data recorded.  Lab Results: Recent Labs    05/28/20 1344  WBC 10.2  HGB 16.8  HCT 50.0  PLT 269   BMET Recent Labs    05/28/20 1344  NA 141  K 4.0  CL 107  CO2 24  GLUCOSE 85  BUN 13  CREATININE 0.81  CALCIUM 9.4   LFT Recent Labs    05/28/20 1344  PROT 7.9  ALBUMIN 4.9  AST 163*  ALT 195*  ALKPHOS 44  BILITOT 1.8*     Edman Circle, MD 05/28/2020, 3:38 PM [facility] [phone]

## 2020-05-28 NOTE — Transfer of Care (Signed)
Immediate Anesthesia Transfer of Care Note  Patient: Renso Swett Mahany  Procedure(s) Performed: ESOPHAGOGASTRODUODENOSCOPY (EGD) WITH PROPOFOL (N/A )  Patient Location: Endoscopy   Anesthesia Type:MAC  Level of Consciousness: awake, alert  and oriented  Airway & Oxygen Therapy: Patient Spontanous Breathing and Patient connected to nasal cannula oxygen  Post-op Assessment: Report given to RN and Post -op Vital signs reviewed and stable  Post vital signs: Reviewed and stable  Last Vitals:  Vitals Value Taken Time  BP 143/81 05/28/20 1624  Temp 36.4 C 05/28/20 1624  Pulse 100 05/28/20 1627  Resp 24 05/28/20 1627  SpO2 98 % 05/28/20 1627  Vitals shown include unvalidated device data.  Last Pain:  Vitals:   05/28/20 1624  TempSrc: Temporal  PainSc: 0-No pain         Complications: No complications documented.

## 2020-05-28 NOTE — Op Note (Addendum)
[facility] Patient Name: Edward EastRobert Grant Procedure Date : 05/28/2020 MRN: [id number] Attending MD: Lynann Bolognaajesh Namari Breton , MD Date of Birth: [birth year] CSN: [id number] Age: [age] Admit Type: Inpatient Procedure:                Upper GI endoscopy Indications:              Foreign body in the esophagus (pizza) since last                            night. Providers:                Lynann Bolognaajesh Altamese Deguire, MD, Shelda Jakeshristina Griffith, RN, Dayton BailiffMaggie                            Chrismon, RN, Noel ChristmasEmilee Barr, CRNA Referring MD:              Medicines:                Propofol per Anesthesia Complications:            No immediate complications. Estimated Blood Loss:     Estimated blood loss was minimal. Procedure:                Pre-Anesthesia Assessment:                           - Prior to the procedure, a History and Physical                            was performed, and patient medications and                            allergies were reviewed. The patient's tolerance of                            previous anesthesia was also reviewed. The risks                            and benefits of the procedure and the sedation                            options and risks were discussed with the patient.                            All questions were answered, and informed consent                            was obtained. Prior Anticoagulants: The patient has                            taken no previous anticoagulant or antiplatelet                            agents. ASA Grade Assessment: II - A patient with  mild systemic disease. After reviewing the risks                            and benefits, the patient was deemed in                            satisfactory condition to undergo the procedure.                           After obtaining informed consent, the endoscope was                            passed under direct vision. Throughout the                            procedure, the patient's  blood pressure, pulse, and                            oxygen saturations were monitored continuously. The                            GIF-[id number] ([id number] Olympus gastroscope was                            introduced through the mouth, and advanced to the                            second part of duodenum. The upper GI endoscopy was                            accomplished without difficulty. The patient                            tolerated the procedure well. Scope In: Scope Out: Findings:      Food impaction was found in the lower third of the esophagus. There was       linear tear noted. Removal of food was accomplished by snare. Lower part       of the food spontaneously passed into the stomach. Estimated blood loss       was minimal.      Underlying moderately severe esophagitis was found in the lower third of       the esophagus.      Diffuse mild inflammation characterized by erythema was found in the       entire examined stomach.      The examined duodenum was normal. Impression:               - Food impaction in the lower third of the                            esophagus. Removal was successful.                           - Moderately severe esophagitis                           -  Gastritis. Recommendation:           - Patient has a contact number available for                            emergencies. The signs and symptoms of potential                            delayed complications were discussed with the                            patient. Return to normal activities tomorrow.                            Written discharge instructions were provided to the                            patient.                           - Full liquid diet today. Advance to soft diet in AM                           - Use Prilosec (omeprazole) OTC 20 mg PO BID .                           - Return to GI clinic in 3-4 weeks for repeat EGD                            with Bx (to r/o EoE and dil.                            - The findings and recommendations were discussed                            with the pt. I have tried calling patient's mom but                            there was no answer.                           - Stop all ETOH.                           - Watch for any delayed complications including                            bleeding or perforation. If he has any problems, I                            would have low threshold in getting CT chest.                           - The findings and recommendations were discussed  with the patient's family. I was able to call                            patient's sister Nehemiah Settle and discussed in detail                            regarding procedure, potential for delayed                            complications including perforation, need for                            compliance with follow-up. She understands. Procedure Code(s):        --- Professional ---                           [phone], Esophagogastroduodenoscopy, flexible,                            transoral; with removal of foreign body(s) Diagnosis Code(s):        --- Professional ---                           K81.275T, Food in esophagus causing other injury,                            initial encounter                           K20.90, Esophagitis, unspecified without bleeding                           K29.70, Gastritis, unspecified, without bleeding                           T18.108A, Unspecified foreign body in esophagus                            causing other injury, initial encounter CPT copyright 2019 [employer]. All rights reserved. The codes documented in this report are preliminary and upon coder review may  be revised to meet current compliance requirements. Lynann Bologna, MD 05/28/2020 4:29:33 PM This report has been signed electronically. Number of Addenda: 0

## 2020-05-28 NOTE — Anesthesia Postprocedure Evaluation (Signed)
Anesthesia Post Note  Patient: Edward Grant  Procedure(s) Performed: ESOPHAGOGASTRODUODENOSCOPY (EGD) WITH PROPOFOL (N/A )     Patient location during evaluation: PACU Anesthesia Type: MAC Level of consciousness: awake and alert Pain management: pain level controlled Vital Signs Assessment: post-procedure vital signs reviewed and stable Respiratory status: spontaneous breathing, nonlabored ventilation and respiratory function stable Cardiovascular status: blood pressure returned to baseline and stable Postop Assessment: no apparent nausea or vomiting Anesthetic complications: no   No complications documented.  Last Vitals:  Vitals:   05/28/20 1624 05/28/20 1637  BP: (!) 143/81 (!) 158/86  Pulse: (!) 103 (!) 101  Resp: (!) 32 (!) 24  Temp: (!) 36.4 C   SpO2: 97% 97%    Last Pain:  Vitals:   05/28/20 1637  TempSrc:   PainSc: [place]

## 2020-05-30 ENCOUNTER — Encounter (HOSPITAL_COMMUNITY): Payer: Self-pay | Admitting: Gastroenterology

## 2020-06-08 ENCOUNTER — Encounter (HOSPITAL_COMMUNITY): Payer: Self-pay | Admitting: Gastroenterology

## 2020-06-08 NOTE — Addendum Note (Signed)
Addendum  created 06/08/20 0143 by Lannie Fields, DO   Intraprocedure Event edited, Intraprocedure Staff edited

## 2020-08-03 ENCOUNTER — Other Ambulatory Visit: Payer: Self-pay

## 2020-08-03 ENCOUNTER — Emergency Department (HOSPITAL_COMMUNITY)
Admission: EM | Admit: 2020-08-03 | Discharge: 2020-08-03 | Discharge status: Against Medical Advice | Payer: BC Managed Care – PPO

## 2020-08-04 ENCOUNTER — Emergency Department (HOSPITAL_COMMUNITY)
Admission: EM | Admit: 2020-08-04 | Discharge: 2020-08-04 | Disposition: A | Discharge status: Home / Self Care | Payer: BC Managed Care – PPO | Attending: Emergency Medicine | Admitting: Emergency Medicine

## 2020-08-04 ENCOUNTER — Encounter (HOSPITAL_COMMUNITY): Payer: Self-pay

## 2020-08-04 DIAGNOSIS — R000 Tachycardia, unspecified: Secondary | ICD-10-CM | POA: Diagnosis not present

## 2020-08-04 DIAGNOSIS — Z87891 Personal history of nicotine dependence: Secondary | ICD-10-CM | POA: Diagnosis not present

## 2020-08-04 DIAGNOSIS — J45909 Unspecified asthma, uncomplicated: Secondary | ICD-10-CM | POA: Diagnosis not present

## 2020-08-04 DIAGNOSIS — F10920 Alcohol use, unspecified with intoxication, uncomplicated: Secondary | ICD-10-CM

## 2020-08-04 DIAGNOSIS — F10929 Alcohol use, unspecified with intoxication, unspecified: Secondary | ICD-10-CM | POA: Diagnosis not present

## 2020-08-04 DIAGNOSIS — R7401 Elevation of levels of liver transaminase levels: Secondary | ICD-10-CM | POA: Diagnosis not present

## 2020-08-04 DIAGNOSIS — R0902 Hypoxemia: Secondary | ICD-10-CM | POA: Diagnosis not present

## 2020-08-04 DIAGNOSIS — Y908 Blood alcohol level of 240 mg/100 ml or more: Secondary | ICD-10-CM | POA: Diagnosis not present

## 2020-08-04 DIAGNOSIS — F10129 Alcohol abuse with intoxication, unspecified: Secondary | ICD-10-CM | POA: Diagnosis not present

## 2020-08-04 DIAGNOSIS — I10 Essential (primary) hypertension: Secondary | ICD-10-CM | POA: Diagnosis not present

## 2020-08-04 LAB — CBC
HCT: 46.9 % (ref 39.0–52.0)
Hemoglobin: 16.1 g/dL (ref 13.0–17.0)
MCH: 33.2 pg (ref 26.0–34.0)
MCHC: 34.3 g/dL (ref 30.0–36.0)
MCV: 96.7 fL (ref 80.0–100.0)
Platelets: 238 10*3/uL (ref 150–400)
RBC: 4.85 MIL/uL (ref 4.22–5.81)
RDW: 13.2 % (ref 11.5–15.5)
WBC: 6.1 10*3/uL (ref 4.0–10.5)
nRBC: 0 % (ref 0.0–0.2)

## 2020-08-04 LAB — ETHANOL: Alcohol, Ethyl (B): 452 mg/dL (ref <10)

## 2020-08-04 LAB — COMPREHENSIVE METABOLIC PANEL
ALT: 202 U/L — ABNORMAL HIGH (ref 0–44)
AST: 160 U/L — ABNORMAL HIGH (ref 15–41)
Albumin: 4.8 g/dL (ref 3.5–5.0)
Alkaline Phosphatase: 56 U/L (ref 38–126)
Anion gap: 12 (ref 5–15)
BUN: 8 mg/dL (ref 6–20)
CO2: 26 mmol/L (ref 22–32)
Calcium: 8.4 mg/dL — ABNORMAL LOW (ref 8.9–10.3)
Chloride: 106 mmol/L (ref 98–111)
Creatinine, Ser: 1.02 mg/dL (ref 0.61–1.24)
GFR, Estimated: >60 mL/min (ref >60)
Glucose, Bld: 114 mg/dL — ABNORMAL HIGH (ref 70–99)
Potassium: 4.8 mmol/L (ref 3.5–5.1)
Sodium: 144 mmol/L (ref 135–145)
Total Bilirubin: 1.3 mg/dL — ABNORMAL HIGH (ref 0.3–1.2)
Total Protein: 7.8 g/dL (ref 6.5–8.1)

## 2020-08-04 MED ORDER — THIAMINE HCL 100 MG/ML IJ SOLN
100.0000 mg | Freq: Every day | INTRAMUSCULAR | Status: DC
  Administered 2020-08-04: 100 mg via INTRAVENOUS
  Filled 2020-08-04: qty 2

## 2020-08-04 MED ORDER — DICYCLOMINE HCL 20 MG PO TABS: 20.0000 mg | ORAL_TABLET | Freq: Two times a day (BID) | ORAL | 0 refills | Status: AC

## 2020-08-04 MED ORDER — THIAMINE HCL 100 MG PO TABS: 100.0000 mg | ORAL_TABLET | Freq: Every day | ORAL | Status: DC

## 2020-08-04 MED ORDER — ACETAMINOPHEN 325 MG PO TABS: 650.0000 mg | ORAL_TABLET | ORAL | Status: DC | PRN

## 2020-08-04 MED ORDER — ONDANSETRON HCL 4 MG PO TABS: 4.0000 mg | ORAL_TABLET | Freq: Three times a day (TID) | ORAL | Status: DC | PRN

## 2020-08-04 MED ORDER — ALUM & MAG HYDROXIDE-SIMETH 200-200-20 MG/5ML PO SUSP: 30.0000 mL | Freq: Four times a day (QID) | ORAL | Status: DC | PRN

## 2020-08-04 MED ORDER — LORAZEPAM 2 MG/ML IJ SOLN: 0.0000 mg | Freq: Two times a day (BID) | INTRAMUSCULAR | Status: DC

## 2020-08-04 MED ORDER — SODIUM CHLORIDE 0.9 % IV BOLUS
1000.0000 mL | Freq: Once | INTRAVENOUS | Status: AC
  Administered 2020-08-04: 1000 mL via INTRAVENOUS

## 2020-08-04 MED ORDER — LORAZEPAM 1 MG PO TABS: 0.0000 mg | ORAL_TABLET | Freq: Two times a day (BID) | ORAL | Status: DC

## 2020-08-04 MED ORDER — LORAZEPAM 1 MG PO TABS: 0.0000 mg | ORAL_TABLET | Freq: Four times a day (QID) | ORAL | Status: DC

## 2020-08-04 MED ORDER — ZOLPIDEM TARTRATE 5 MG PO TABS: 5.0000 mg | ORAL_TABLET | Freq: Every evening | ORAL | Status: DC | PRN

## 2020-08-04 MED ORDER — LORAZEPAM 2 MG/ML IJ SOLN
0.0000 mg | Freq: Four times a day (QID) | INTRAMUSCULAR | Status: DC
  Administered 2020-08-04: 2 mg via INTRAVENOUS
  Filled 2020-08-04: qty 1

## 2020-08-04 MED ORDER — ONDANSETRON HCL 4 MG PO TABS: 4.0000 mg | ORAL_TABLET | Freq: Three times a day (TID) | ORAL | 0 refills | Status: DC | PRN

## 2020-08-04 MED ORDER — NICOTINE 21 MG/24HR TD PT24
21.0000 mg | MEDICATED_PATCH | Freq: Every day | TRANSDERMAL | Status: DC
  Filled 2020-08-04: qty 1

## 2020-08-04 MED ORDER — HYDROXYZINE HCL 25 MG PO TABS: 25.0000 mg | ORAL_TABLET | Freq: Four times a day (QID) | ORAL | 0 refills | Status: AC | PRN
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
## 2020-08-04 NOTE — ED Notes (Signed)
Ethanol 452. Received from lab.

## 2020-08-04 NOTE — ED Provider Notes (Signed)
[facility]-EMERGENCY DEPT Provider Note   CSN: [id number] Arrival date & time: 08/04/20  1640     History No chief complaint on file.   Edward Grant is a 30 y.o. male.  The history is provided by the patient and medical records. No language interpreter was used.  Alcohol Intoxication     30 year old male significant history of alcohol abuse, depression, asthma, brought here via EMS with concerns of alcohol intoxication.  Patient is a poor historian.  Patient states today he went to a drug rehab facility for alcohol abuse.  However, his alcohol breathalyzer test was 0.4 and therefore he was sent home.  He believes his sister may have called EMS to bring him here to the hospital.  He did admits to drinking 1/5 of liquor and approximately 1 hour span prior to going to rehab.  He admits to occasional use of Xanax.  He denies any SI or HI denies nausea vomiting diarrhea or having significant pain.  He does admit to feeling depressed.  He admits to heavy alcohol use and strong family history of alcohol abuse  Past Medical History:  Diagnosis Date  . Asthma     Patient Active Problem List   Diagnosis Date Noted  . Esophageal obstruction due to food impaction   . Alcohol use disorder, severe, dependence (HCC) 11/21/2016  . Alcohol abuse 11/20/2016  . Major depressive disorder, recurrent severe without psychotic features (HCC) 11/20/2016  . ALLERGIC RHINITIS 07/05/2009  . GASTROENTERITIS WITHOUT DEHYDRATION 06/08/2009  . ANXIETY 08/17/2008  . CONJUNCTIVITIS, ALLERGIC 04/26/2006  . ASTHMA, INTERMITTENT 04/26/2006    Past Surgical History:  Procedure Laterality Date  . ESOPHAGOGASTRODUODENOSCOPY (EGD) WITH PROPOFOL N/A 05/28/2020   Procedure: ESOPHAGOGASTRODUODENOSCOPY (EGD) WITH PROPOFOL;  Surgeon: Lynann Bologna, MD;  Location: [facility] ENDOSCOPY;  Service: Endoscopy;  Laterality: N/A;  . FOREIGN BODY REMOVAL  05/28/2020   Procedure: FOREIGN BODY REMOVAL;  Surgeon:  Lynann Bologna, MD;  Location: [facility] ENDOSCOPY;  Service: Endoscopy;;       No family history on file.  Social History   Tobacco Use  . Smoking status: Former Games developer  . Smokeless tobacco: Never Used  Substance Use Topics  . Alcohol use: Yes  . Drug use: No    Home Medications Prior to Admission medications   Medication Sig Start Date End Date Taking? Authorizing Provider  ALPRAZolam Prudy Feeler) 1 MG tablet Take 1 mg by mouth 2 (two) times daily as needed for anxiety. 04/30/20   [provider]  FLUoxetine (PROZAC) 20 MG capsule Take 3 capsules (60 mg total) by mouth daily. For depression Patient not taking: No sig reported 11/23/16   Armandina Stammer I, NP  hydrOXYzine (ATARAX/VISTARIL) 25 MG tablet Take 1 tablet (25 mg total) by mouth every 6 (six) hours as needed for anxiety. Patient not taking: No sig reported 11/22/16   Armandina Stammer I, NP  omeprazole (PRILOSEC OTC) 20 MG tablet Take 1 tablet (20 mg total) by mouth 2 (two) times daily. 05/28/20 05/28/21  Lynann Bologna, MD    Allergies    Patient has no known allergies.  Review of Systems   Review of Systems  Unable to perform ROS: Mental status change    Physical Exam Updated Vital Signs BP (!) 126/115   Pulse (!) 105   Temp 98.4 F (36.9 C) (Oral)   Resp 19   Ht 5\' 10"  (1.778 m)   Wt 99.8 kg   SpO2 95%   BMI 31.57 kg/m  Physical Exam Vitals and nursing note reviewed.  Constitutional:      General: He is not in acute distress.    Appearance: He is well-developed.     Comments: Appears intoxicated  HENT:     Head: Atraumatic.     Mouth/Throat:     Comments: Breath smells of EtOH Eyes:     Conjunctiva/sclera: Conjunctivae normal.  Cardiovascular:     Rate and Rhythm: Tachycardia present.     Pulses: Normal pulses.     Heart sounds: Normal heart sounds.  Pulmonary:     Effort: Pulmonary effort is normal.     Breath sounds: Normal breath sounds.  Abdominal:     Palpations: Abdomen is soft.     Tenderness:  There is no abdominal tenderness.  Musculoskeletal:        General: Normal range of motion.     Cervical back: Neck supple.  Skin:    Findings: No rash.  Neurological:     Mental Status: He is alert.     GCS: GCS eye subscore is 4. GCS verbal subscore is 5. GCS motor subscore is 6.  Psychiatric:        Mood and Affect: Mood is depressed.        Speech: Speech is slurred.        Thought Content: Thought content does not include homicidal or suicidal ideation.     ED Results / Procedures / Treatments   Labs (all labs ordered are listed, but only abnormal results are displayed) Labs Reviewed  COMPREHENSIVE METABOLIC PANEL - Abnormal; Notable for the following components:      Result Value   Glucose, Bld 114 (*)    Calcium 8.4 (*)    AST 160 (*)    ALT 202 (*)    Total Bilirubin 1.3 (*)    All other components within normal limits  ETHANOL - Abnormal; Notable for the following components:   Alcohol, Ethyl (B) 452 (*)    All other components within normal limits  CBC  RAPID URINE DRUG SCREEN, HOSP PERFORMED    EKG None  Radiology No results found.  Procedures Procedures   Medications Ordered in ED Medications  LORazepam (ATIVAN) injection 0-4 mg (2 mg Intravenous Given 08/04/20 1729)    Or  LORazepam (ATIVAN) tablet 0-4 mg ( Oral See Alternative 08/04/20 1729)  LORazepam (ATIVAN) injection 0-4 mg (has no administration in time range)    Or  LORazepam (ATIVAN) tablet 0-4 mg (has no administration in time range)  thiamine tablet 100 mg ( Oral See Alternative 08/04/20 1731)    Or  thiamine (B-1) injection 100 mg (100 mg Intravenous Given 08/04/20 1731)  acetaminophen (TYLENOL) tablet 650 mg (has no administration in time range)  zolpidem (AMBIEN) tablet 5 mg (has no administration in time range)  ondansetron (ZOFRAN) tablet 4 mg (has no administration in time range)  alum & mag hydroxide-simeth (MAALOX/MYLANTA) 200-200-20 MG/5ML suspension 30 mL (has no administration in  time range)  nicotine (NICODERM CQ - dosed in mg/24 hours) patch 21 mg (21 mg Transdermal Patient Refused/Not Given 08/04/20 1732)  sodium chloride 0.9 % bolus 1,000 mL (0 mLs Intravenous Stopped 08/04/20 1800)  sodium chloride 0.9 % bolus 1,000 mL (1,000 mLs Intravenous New Bag/Given 08/04/20 2031)    ED Course  I have reviewed the triage vital signs and the nursing notes.  Pertinent labs & imaging results that were available during my care of the patient were reviewed by me and considered  in my medical decision making (see chart for details).    MDM Rules/Calculators/A&P                          BP (!) 135/97   Pulse (!) 109   Temp 98.4 F (36.9 C) (Oral)   Resp (!) 22   Ht 5\' 10"  (1.778 m)   Wt 99.8 kg   SpO2 97%   BMI 31.57 kg/m   Final Clinical Impression(s) / ED Diagnoses Final diagnoses:  Acute alcoholic intoxication without complication (HCC)  Transaminitis    Rx / DC Orders ED Discharge Orders         Ordered    ondansetron (ZOFRAN) 4 MG tablet  Every 8 hours PRN        08/04/20 2111    dicyclomine (BENTYL) 20 MG tablet  2 times daily        08/04/20 2111    hydrOXYzine (ATARAX/VISTARIL) 25 MG tablet  Every 6 hours PRN        08/04/20 2111         5:08 PM Patient with history of alcohol abuse who went to alcohol rehab today but was sent home due to having a 0.4 alcohol level on a breathalyzer test.  I believe his sister called EMS to bring him here for management.  Last alcohol use was early on today.  Patient does appears intoxicated but denies any SI or HI.  Does endorse some depression.  Will provide supportive care  7:41 PM Patient does have transaminitis with AST 160, ALT 202, total bili of 1.3.  This is likely secondary to persistent alcohol use.  Value quite similar to previous.  Alcohol level is at 452.  Patient did receive IV fluid and heart rate and showing some improvement.  O2 sats was low at 90% likely due to severe intoxication.  Family member is now  at bedside and patient appears to be elevated more awake.  We will continue with IV hydration, will monitor closely.  9:09 PM Patient is more sober, able to ambulate, sister at bedside feel comfortable taking him home.  Will discharge home with medication to help with alcohol withdrawals and patient will follow-up closely with outpatient detox for further care.  Return precaution given.   2112, PA-C 08/04/20 2112    2113, MD 08/05/20 1130

## 2020-08-04 NOTE — Discharge Instructions (Signed)
Please avoid alcohol use and resume going to alcohol rehab center for further management of your addiction.

## 2020-08-05 DIAGNOSIS — F329 Major depressive disorder, single episode, unspecified: Secondary | ICD-10-CM | POA: Diagnosis not present

## 2020-08-05 DIAGNOSIS — J302 Other seasonal allergic rhinitis: Secondary | ICD-10-CM | POA: Diagnosis not present

## 2020-08-05 DIAGNOSIS — K2289 Other specified disease of esophagus: Secondary | ICD-10-CM | POA: Diagnosis not present

## 2020-08-05 DIAGNOSIS — F410 Panic disorder [episodic paroxysmal anxiety] without agoraphobia: Secondary | ICD-10-CM | POA: Diagnosis not present

## 2020-08-05 DIAGNOSIS — R197 Diarrhea, unspecified: Secondary | ICD-10-CM | POA: Diagnosis not present

## 2020-08-05 DIAGNOSIS — F419 Anxiety disorder, unspecified: Secondary | ICD-10-CM | POA: Diagnosis not present

## 2020-08-05 DIAGNOSIS — J45909 Unspecified asthma, uncomplicated: Secondary | ICD-10-CM | POA: Diagnosis not present

## 2020-08-05 DIAGNOSIS — R7401 Elevation of levels of liver transaminase levels: Secondary | ICD-10-CM | POA: Diagnosis not present

## 2020-08-05 DIAGNOSIS — F1020 Alcohol dependence, uncomplicated: Secondary | ICD-10-CM | POA: Diagnosis not present

## 2020-08-05 DIAGNOSIS — F1320 Sedative, hypnotic or anxiolytic dependence, uncomplicated: Secondary | ICD-10-CM | POA: Diagnosis not present

## 2020-08-05 DIAGNOSIS — Z6832 Body mass index (BMI) 32.0-32.9, adult: Secondary | ICD-10-CM | POA: Diagnosis not present

## 2020-08-07 DIAGNOSIS — F329 Major depressive disorder, single episode, unspecified: Secondary | ICD-10-CM | POA: Diagnosis not present

## 2020-08-07 DIAGNOSIS — F419 Anxiety disorder, unspecified: Secondary | ICD-10-CM | POA: Diagnosis not present

## 2020-08-07 DIAGNOSIS — F1020 Alcohol dependence, uncomplicated: Secondary | ICD-10-CM | POA: Diagnosis not present

## 2020-08-12 DIAGNOSIS — F1320 Sedative, hypnotic or anxiolytic dependence, uncomplicated: Secondary | ICD-10-CM | POA: Diagnosis not present

## 2020-08-12 DIAGNOSIS — F329 Major depressive disorder, single episode, unspecified: Secondary | ICD-10-CM | POA: Diagnosis not present

## 2020-08-12 DIAGNOSIS — R7401 Elevation of levels of liver transaminase levels: Secondary | ICD-10-CM | POA: Diagnosis not present

## 2020-08-12 DIAGNOSIS — F410 Panic disorder [episodic paroxysmal anxiety] without agoraphobia: Secondary | ICD-10-CM | POA: Diagnosis not present

## 2020-08-12 DIAGNOSIS — J302 Other seasonal allergic rhinitis: Secondary | ICD-10-CM | POA: Diagnosis not present

## 2020-08-12 DIAGNOSIS — Z6832 Body mass index (BMI) 32.0-32.9, adult: Secondary | ICD-10-CM | POA: Diagnosis not present

## 2020-08-12 DIAGNOSIS — F1020 Alcohol dependence, uncomplicated: Secondary | ICD-10-CM | POA: Diagnosis not present

## 2020-08-12 DIAGNOSIS — F419 Anxiety disorder, unspecified: Secondary | ICD-10-CM | POA: Diagnosis not present

## 2020-08-12 DIAGNOSIS — J45909 Unspecified asthma, uncomplicated: Secondary | ICD-10-CM | POA: Diagnosis not present

## 2020-08-12 DIAGNOSIS — K2289 Other specified disease of esophagus: Secondary | ICD-10-CM | POA: Diagnosis not present

## 2020-08-12 DIAGNOSIS — R197 Diarrhea, unspecified: Secondary | ICD-10-CM | POA: Diagnosis not present

## 2020-08-13 DIAGNOSIS — Z6832 Body mass index (BMI) 32.0-32.9, adult: Secondary | ICD-10-CM | POA: Diagnosis not present

## 2020-08-13 DIAGNOSIS — F1320 Sedative, hypnotic or anxiolytic dependence, uncomplicated: Secondary | ICD-10-CM | POA: Diagnosis not present

## 2020-08-13 DIAGNOSIS — F329 Major depressive disorder, single episode, unspecified: Secondary | ICD-10-CM | POA: Diagnosis not present

## 2020-08-13 DIAGNOSIS — F410 Panic disorder [episodic paroxysmal anxiety] without agoraphobia: Secondary | ICD-10-CM | POA: Diagnosis not present

## 2020-08-13 DIAGNOSIS — R7401 Elevation of levels of liver transaminase levels: Secondary | ICD-10-CM | POA: Diagnosis not present

## 2020-08-13 DIAGNOSIS — J302 Other seasonal allergic rhinitis: Secondary | ICD-10-CM | POA: Diagnosis not present

## 2020-08-13 DIAGNOSIS — F419 Anxiety disorder, unspecified: Secondary | ICD-10-CM | POA: Diagnosis not present

## 2020-08-13 DIAGNOSIS — K2289 Other specified disease of esophagus: Secondary | ICD-10-CM | POA: Diagnosis not present

## 2020-08-13 DIAGNOSIS — F1020 Alcohol dependence, uncomplicated: Secondary | ICD-10-CM | POA: Diagnosis not present

## 2020-08-13 DIAGNOSIS — J45909 Unspecified asthma, uncomplicated: Secondary | ICD-10-CM | POA: Diagnosis not present

## 2020-08-13 DIAGNOSIS — R197 Diarrhea, unspecified: Secondary | ICD-10-CM | POA: Diagnosis not present

## 2020-08-14 DIAGNOSIS — J45909 Unspecified asthma, uncomplicated: Secondary | ICD-10-CM | POA: Diagnosis not present

## 2020-08-14 DIAGNOSIS — F329 Major depressive disorder, single episode, unspecified: Secondary | ICD-10-CM | POA: Diagnosis not present

## 2020-08-14 DIAGNOSIS — J302 Other seasonal allergic rhinitis: Secondary | ICD-10-CM | POA: Diagnosis not present

## 2020-08-14 DIAGNOSIS — F410 Panic disorder [episodic paroxysmal anxiety] without agoraphobia: Secondary | ICD-10-CM | POA: Diagnosis not present

## 2020-08-14 DIAGNOSIS — F1020 Alcohol dependence, uncomplicated: Secondary | ICD-10-CM | POA: Diagnosis not present

## 2020-08-14 DIAGNOSIS — F1320 Sedative, hypnotic or anxiolytic dependence, uncomplicated: Secondary | ICD-10-CM | POA: Diagnosis not present

## 2020-08-14 DIAGNOSIS — R7401 Elevation of levels of liver transaminase levels: Secondary | ICD-10-CM | POA: Diagnosis not present

## 2020-08-14 DIAGNOSIS — R197 Diarrhea, unspecified: Secondary | ICD-10-CM | POA: Diagnosis not present

## 2020-08-14 DIAGNOSIS — Z6832 Body mass index (BMI) 32.0-32.9, adult: Secondary | ICD-10-CM | POA: Diagnosis not present

## 2020-08-14 DIAGNOSIS — K2289 Other specified disease of esophagus: Secondary | ICD-10-CM | POA: Diagnosis not present

## 2020-08-14 DIAGNOSIS — F419 Anxiety disorder, unspecified: Secondary | ICD-10-CM | POA: Diagnosis not present

## 2020-08-15 DIAGNOSIS — F1020 Alcohol dependence, uncomplicated: Secondary | ICD-10-CM | POA: Diagnosis not present

## 2020-08-15 DIAGNOSIS — J45909 Unspecified asthma, uncomplicated: Secondary | ICD-10-CM | POA: Diagnosis not present

## 2020-08-15 DIAGNOSIS — R7401 Elevation of levels of liver transaminase levels: Secondary | ICD-10-CM | POA: Diagnosis not present

## 2020-08-15 DIAGNOSIS — Z6832 Body mass index (BMI) 32.0-32.9, adult: Secondary | ICD-10-CM | POA: Diagnosis not present

## 2020-08-15 DIAGNOSIS — F410 Panic disorder [episodic paroxysmal anxiety] without agoraphobia: Secondary | ICD-10-CM | POA: Diagnosis not present

## 2020-08-15 DIAGNOSIS — F1320 Sedative, hypnotic or anxiolytic dependence, uncomplicated: Secondary | ICD-10-CM | POA: Diagnosis not present

## 2020-08-15 DIAGNOSIS — F419 Anxiety disorder, unspecified: Secondary | ICD-10-CM | POA: Diagnosis not present

## 2020-08-15 DIAGNOSIS — R197 Diarrhea, unspecified: Secondary | ICD-10-CM | POA: Diagnosis not present

## 2020-08-15 DIAGNOSIS — K2289 Other specified disease of esophagus: Secondary | ICD-10-CM | POA: Diagnosis not present

## 2020-08-15 DIAGNOSIS — F329 Major depressive disorder, single episode, unspecified: Secondary | ICD-10-CM | POA: Diagnosis not present

## 2020-08-15 DIAGNOSIS — J302 Other seasonal allergic rhinitis: Secondary | ICD-10-CM | POA: Diagnosis not present

## 2020-08-16 DIAGNOSIS — F419 Anxiety disorder, unspecified: Secondary | ICD-10-CM | POA: Diagnosis not present

## 2020-08-16 DIAGNOSIS — J302 Other seasonal allergic rhinitis: Secondary | ICD-10-CM | POA: Diagnosis not present

## 2020-08-16 DIAGNOSIS — R197 Diarrhea, unspecified: Secondary | ICD-10-CM | POA: Diagnosis not present

## 2020-08-16 DIAGNOSIS — Z6832 Body mass index (BMI) 32.0-32.9, adult: Secondary | ICD-10-CM | POA: Diagnosis not present

## 2020-08-16 DIAGNOSIS — F410 Panic disorder [episodic paroxysmal anxiety] without agoraphobia: Secondary | ICD-10-CM | POA: Diagnosis not present

## 2020-08-16 DIAGNOSIS — F1020 Alcohol dependence, uncomplicated: Secondary | ICD-10-CM | POA: Diagnosis not present

## 2020-08-16 DIAGNOSIS — F329 Major depressive disorder, single episode, unspecified: Secondary | ICD-10-CM | POA: Diagnosis not present

## 2020-08-16 DIAGNOSIS — F1320 Sedative, hypnotic or anxiolytic dependence, uncomplicated: Secondary | ICD-10-CM | POA: Diagnosis not present

## 2020-08-16 DIAGNOSIS — R7401 Elevation of levels of liver transaminase levels: Secondary | ICD-10-CM | POA: Diagnosis not present

## 2020-08-16 DIAGNOSIS — K2289 Other specified disease of esophagus: Secondary | ICD-10-CM | POA: Diagnosis not present

## 2020-08-16 DIAGNOSIS — J45909 Unspecified asthma, uncomplicated: Secondary | ICD-10-CM | POA: Diagnosis not present

## 2020-08-17 DIAGNOSIS — Z6832 Body mass index (BMI) 32.0-32.9, adult: Secondary | ICD-10-CM | POA: Diagnosis not present

## 2020-08-17 DIAGNOSIS — R197 Diarrhea, unspecified: Secondary | ICD-10-CM | POA: Diagnosis not present

## 2020-08-17 DIAGNOSIS — F419 Anxiety disorder, unspecified: Secondary | ICD-10-CM | POA: Diagnosis not present

## 2020-08-17 DIAGNOSIS — J302 Other seasonal allergic rhinitis: Secondary | ICD-10-CM | POA: Diagnosis not present

## 2020-08-17 DIAGNOSIS — F329 Major depressive disorder, single episode, unspecified: Secondary | ICD-10-CM | POA: Diagnosis not present

## 2020-08-17 DIAGNOSIS — R7401 Elevation of levels of liver transaminase levels: Secondary | ICD-10-CM | POA: Diagnosis not present

## 2020-08-17 DIAGNOSIS — J45909 Unspecified asthma, uncomplicated: Secondary | ICD-10-CM | POA: Diagnosis not present

## 2020-08-17 DIAGNOSIS — F1020 Alcohol dependence, uncomplicated: Secondary | ICD-10-CM | POA: Diagnosis not present

## 2020-08-17 DIAGNOSIS — K2289 Other specified disease of esophagus: Secondary | ICD-10-CM | POA: Diagnosis not present

## 2020-08-17 DIAGNOSIS — F410 Panic disorder [episodic paroxysmal anxiety] without agoraphobia: Secondary | ICD-10-CM | POA: Diagnosis not present

## 2020-08-17 DIAGNOSIS — F1320 Sedative, hypnotic or anxiolytic dependence, uncomplicated: Secondary | ICD-10-CM | POA: Diagnosis not present

## 2020-08-18 DIAGNOSIS — F410 Panic disorder [episodic paroxysmal anxiety] without agoraphobia: Secondary | ICD-10-CM | POA: Diagnosis not present

## 2020-08-18 DIAGNOSIS — F329 Major depressive disorder, single episode, unspecified: Secondary | ICD-10-CM | POA: Diagnosis not present

## 2020-08-18 DIAGNOSIS — K2289 Other specified disease of esophagus: Secondary | ICD-10-CM | POA: Diagnosis not present

## 2020-08-18 DIAGNOSIS — F1320 Sedative, hypnotic or anxiolytic dependence, uncomplicated: Secondary | ICD-10-CM | POA: Diagnosis not present

## 2020-08-18 DIAGNOSIS — R7401 Elevation of levels of liver transaminase levels: Secondary | ICD-10-CM | POA: Diagnosis not present

## 2020-08-18 DIAGNOSIS — J302 Other seasonal allergic rhinitis: Secondary | ICD-10-CM | POA: Diagnosis not present

## 2020-08-18 DIAGNOSIS — F1020 Alcohol dependence, uncomplicated: Secondary | ICD-10-CM | POA: Diagnosis not present

## 2020-08-18 DIAGNOSIS — F419 Anxiety disorder, unspecified: Secondary | ICD-10-CM | POA: Diagnosis not present

## 2020-08-18 DIAGNOSIS — R197 Diarrhea, unspecified: Secondary | ICD-10-CM | POA: Diagnosis not present

## 2020-08-18 DIAGNOSIS — Z6832 Body mass index (BMI) 32.0-32.9, adult: Secondary | ICD-10-CM | POA: Diagnosis not present

## 2020-08-18 DIAGNOSIS — J45909 Unspecified asthma, uncomplicated: Secondary | ICD-10-CM | POA: Diagnosis not present

## 2020-08-19 DIAGNOSIS — R7401 Elevation of levels of liver transaminase levels: Secondary | ICD-10-CM | POA: Diagnosis not present

## 2020-08-19 DIAGNOSIS — J302 Other seasonal allergic rhinitis: Secondary | ICD-10-CM | POA: Diagnosis not present

## 2020-08-19 DIAGNOSIS — F1320 Sedative, hypnotic or anxiolytic dependence, uncomplicated: Secondary | ICD-10-CM | POA: Diagnosis not present

## 2020-08-19 DIAGNOSIS — K2289 Other specified disease of esophagus: Secondary | ICD-10-CM | POA: Diagnosis not present

## 2020-08-19 DIAGNOSIS — R197 Diarrhea, unspecified: Secondary | ICD-10-CM | POA: Diagnosis not present

## 2020-08-19 DIAGNOSIS — Z6832 Body mass index (BMI) 32.0-32.9, adult: Secondary | ICD-10-CM | POA: Diagnosis not present

## 2020-08-19 DIAGNOSIS — F410 Panic disorder [episodic paroxysmal anxiety] without agoraphobia: Secondary | ICD-10-CM | POA: Diagnosis not present

## 2020-08-19 DIAGNOSIS — F419 Anxiety disorder, unspecified: Secondary | ICD-10-CM | POA: Diagnosis not present

## 2020-08-19 DIAGNOSIS — F329 Major depressive disorder, single episode, unspecified: Secondary | ICD-10-CM | POA: Diagnosis not present

## 2020-08-19 DIAGNOSIS — J45909 Unspecified asthma, uncomplicated: Secondary | ICD-10-CM | POA: Diagnosis not present

## 2020-08-19 DIAGNOSIS — F1020 Alcohol dependence, uncomplicated: Secondary | ICD-10-CM | POA: Diagnosis not present

## 2020-08-20 DIAGNOSIS — J302 Other seasonal allergic rhinitis: Secondary | ICD-10-CM | POA: Diagnosis not present

## 2020-08-20 DIAGNOSIS — F410 Panic disorder [episodic paroxysmal anxiety] without agoraphobia: Secondary | ICD-10-CM | POA: Diagnosis not present

## 2020-08-20 DIAGNOSIS — F329 Major depressive disorder, single episode, unspecified: Secondary | ICD-10-CM | POA: Diagnosis not present

## 2020-08-20 DIAGNOSIS — J45909 Unspecified asthma, uncomplicated: Secondary | ICD-10-CM | POA: Diagnosis not present

## 2020-08-20 DIAGNOSIS — F1320 Sedative, hypnotic or anxiolytic dependence, uncomplicated: Secondary | ICD-10-CM | POA: Diagnosis not present

## 2020-08-20 DIAGNOSIS — R7401 Elevation of levels of liver transaminase levels: Secondary | ICD-10-CM | POA: Diagnosis not present

## 2020-08-20 DIAGNOSIS — K2289 Other specified disease of esophagus: Secondary | ICD-10-CM | POA: Diagnosis not present

## 2020-08-20 DIAGNOSIS — Z6832 Body mass index (BMI) 32.0-32.9, adult: Secondary | ICD-10-CM | POA: Diagnosis not present

## 2020-08-20 DIAGNOSIS — F419 Anxiety disorder, unspecified: Secondary | ICD-10-CM | POA: Diagnosis not present

## 2020-08-20 DIAGNOSIS — F1020 Alcohol dependence, uncomplicated: Secondary | ICD-10-CM | POA: Diagnosis not present

## 2020-08-20 DIAGNOSIS — R197 Diarrhea, unspecified: Secondary | ICD-10-CM | POA: Diagnosis not present

## 2020-08-21 DIAGNOSIS — J302 Other seasonal allergic rhinitis: Secondary | ICD-10-CM | POA: Diagnosis not present

## 2020-08-21 DIAGNOSIS — R197 Diarrhea, unspecified: Secondary | ICD-10-CM | POA: Diagnosis not present

## 2020-08-21 DIAGNOSIS — F1020 Alcohol dependence, uncomplicated: Secondary | ICD-10-CM | POA: Diagnosis not present

## 2020-08-21 DIAGNOSIS — F329 Major depressive disorder, single episode, unspecified: Secondary | ICD-10-CM | POA: Diagnosis not present

## 2020-08-21 DIAGNOSIS — F410 Panic disorder [episodic paroxysmal anxiety] without agoraphobia: Secondary | ICD-10-CM | POA: Diagnosis not present

## 2020-08-21 DIAGNOSIS — F419 Anxiety disorder, unspecified: Secondary | ICD-10-CM | POA: Diagnosis not present

## 2020-08-21 DIAGNOSIS — R7401 Elevation of levels of liver transaminase levels: Secondary | ICD-10-CM | POA: Diagnosis not present

## 2020-08-21 DIAGNOSIS — F1320 Sedative, hypnotic or anxiolytic dependence, uncomplicated: Secondary | ICD-10-CM | POA: Diagnosis not present

## 2020-08-21 DIAGNOSIS — J45909 Unspecified asthma, uncomplicated: Secondary | ICD-10-CM | POA: Diagnosis not present

## 2020-08-21 DIAGNOSIS — Z6832 Body mass index (BMI) 32.0-32.9, adult: Secondary | ICD-10-CM | POA: Diagnosis not present

## 2020-08-21 DIAGNOSIS — K2289 Other specified disease of esophagus: Secondary | ICD-10-CM | POA: Diagnosis not present

## 2020-08-22 DIAGNOSIS — R197 Diarrhea, unspecified: Secondary | ICD-10-CM | POA: Diagnosis not present

## 2020-08-22 DIAGNOSIS — F329 Major depressive disorder, single episode, unspecified: Secondary | ICD-10-CM | POA: Diagnosis not present

## 2020-08-22 DIAGNOSIS — J302 Other seasonal allergic rhinitis: Secondary | ICD-10-CM | POA: Diagnosis not present

## 2020-08-22 DIAGNOSIS — F419 Anxiety disorder, unspecified: Secondary | ICD-10-CM | POA: Diagnosis not present

## 2020-08-22 DIAGNOSIS — R7401 Elevation of levels of liver transaminase levels: Secondary | ICD-10-CM | POA: Diagnosis not present

## 2020-08-22 DIAGNOSIS — F410 Panic disorder [episodic paroxysmal anxiety] without agoraphobia: Secondary | ICD-10-CM | POA: Diagnosis not present

## 2020-08-22 DIAGNOSIS — F1320 Sedative, hypnotic or anxiolytic dependence, uncomplicated: Secondary | ICD-10-CM | POA: Diagnosis not present

## 2020-08-22 DIAGNOSIS — Z6832 Body mass index (BMI) 32.0-32.9, adult: Secondary | ICD-10-CM | POA: Diagnosis not present

## 2020-08-22 DIAGNOSIS — K2289 Other specified disease of esophagus: Secondary | ICD-10-CM | POA: Diagnosis not present

## 2020-08-22 DIAGNOSIS — J45909 Unspecified asthma, uncomplicated: Secondary | ICD-10-CM | POA: Diagnosis not present

## 2020-08-22 DIAGNOSIS — F1020 Alcohol dependence, uncomplicated: Secondary | ICD-10-CM | POA: Diagnosis not present

## 2020-08-23 DIAGNOSIS — K2289 Other specified disease of esophagus: Secondary | ICD-10-CM | POA: Diagnosis not present

## 2020-08-23 DIAGNOSIS — F410 Panic disorder [episodic paroxysmal anxiety] without agoraphobia: Secondary | ICD-10-CM | POA: Diagnosis not present

## 2020-08-23 DIAGNOSIS — F1020 Alcohol dependence, uncomplicated: Secondary | ICD-10-CM | POA: Diagnosis not present

## 2020-08-23 DIAGNOSIS — Z6832 Body mass index (BMI) 32.0-32.9, adult: Secondary | ICD-10-CM | POA: Diagnosis not present

## 2020-08-23 DIAGNOSIS — R197 Diarrhea, unspecified: Secondary | ICD-10-CM | POA: Diagnosis not present

## 2020-08-23 DIAGNOSIS — F329 Major depressive disorder, single episode, unspecified: Secondary | ICD-10-CM | POA: Diagnosis not present

## 2020-08-23 DIAGNOSIS — F419 Anxiety disorder, unspecified: Secondary | ICD-10-CM | POA: Diagnosis not present

## 2020-08-23 DIAGNOSIS — J302 Other seasonal allergic rhinitis: Secondary | ICD-10-CM | POA: Diagnosis not present

## 2020-08-23 DIAGNOSIS — F1320 Sedative, hypnotic or anxiolytic dependence, uncomplicated: Secondary | ICD-10-CM | POA: Diagnosis not present

## 2020-08-23 DIAGNOSIS — R7401 Elevation of levels of liver transaminase levels: Secondary | ICD-10-CM | POA: Diagnosis not present

## 2020-08-23 DIAGNOSIS — J45909 Unspecified asthma, uncomplicated: Secondary | ICD-10-CM | POA: Diagnosis not present

## 2020-08-24 DIAGNOSIS — J45909 Unspecified asthma, uncomplicated: Secondary | ICD-10-CM | POA: Diagnosis not present

## 2020-08-24 DIAGNOSIS — R7401 Elevation of levels of liver transaminase levels: Secondary | ICD-10-CM | POA: Diagnosis not present

## 2020-08-24 DIAGNOSIS — F329 Major depressive disorder, single episode, unspecified: Secondary | ICD-10-CM | POA: Diagnosis not present

## 2020-08-24 DIAGNOSIS — F1020 Alcohol dependence, uncomplicated: Secondary | ICD-10-CM | POA: Diagnosis not present

## 2020-08-24 DIAGNOSIS — F410 Panic disorder [episodic paroxysmal anxiety] without agoraphobia: Secondary | ICD-10-CM | POA: Diagnosis not present

## 2020-08-24 DIAGNOSIS — J302 Other seasonal allergic rhinitis: Secondary | ICD-10-CM | POA: Diagnosis not present

## 2020-08-24 DIAGNOSIS — F419 Anxiety disorder, unspecified: Secondary | ICD-10-CM | POA: Diagnosis not present

## 2020-08-24 DIAGNOSIS — Z6832 Body mass index (BMI) 32.0-32.9, adult: Secondary | ICD-10-CM | POA: Diagnosis not present

## 2020-08-24 DIAGNOSIS — R197 Diarrhea, unspecified: Secondary | ICD-10-CM | POA: Diagnosis not present

## 2020-08-24 DIAGNOSIS — F1320 Sedative, hypnotic or anxiolytic dependence, uncomplicated: Secondary | ICD-10-CM | POA: Diagnosis not present

## 2020-08-24 DIAGNOSIS — K2289 Other specified disease of esophagus: Secondary | ICD-10-CM | POA: Diagnosis not present

## 2020-08-25 DIAGNOSIS — Z6832 Body mass index (BMI) 32.0-32.9, adult: Secondary | ICD-10-CM | POA: Diagnosis not present

## 2020-08-25 DIAGNOSIS — F1020 Alcohol dependence, uncomplicated: Secondary | ICD-10-CM | POA: Diagnosis not present

## 2020-08-25 DIAGNOSIS — J45909 Unspecified asthma, uncomplicated: Secondary | ICD-10-CM | POA: Diagnosis not present

## 2020-08-25 DIAGNOSIS — F1320 Sedative, hypnotic or anxiolytic dependence, uncomplicated: Secondary | ICD-10-CM | POA: Diagnosis not present

## 2020-08-25 DIAGNOSIS — R7401 Elevation of levels of liver transaminase levels: Secondary | ICD-10-CM | POA: Diagnosis not present

## 2020-08-25 DIAGNOSIS — F329 Major depressive disorder, single episode, unspecified: Secondary | ICD-10-CM | POA: Diagnosis not present

## 2020-08-25 DIAGNOSIS — F419 Anxiety disorder, unspecified: Secondary | ICD-10-CM | POA: Diagnosis not present

## 2020-08-25 DIAGNOSIS — F410 Panic disorder [episodic paroxysmal anxiety] without agoraphobia: Secondary | ICD-10-CM | POA: Diagnosis not present

## 2020-08-25 DIAGNOSIS — K2289 Other specified disease of esophagus: Secondary | ICD-10-CM | POA: Diagnosis not present

## 2020-08-25 DIAGNOSIS — J302 Other seasonal allergic rhinitis: Secondary | ICD-10-CM | POA: Diagnosis not present

## 2020-08-25 DIAGNOSIS — R197 Diarrhea, unspecified: Secondary | ICD-10-CM | POA: Diagnosis not present

## 2020-08-26 DIAGNOSIS — F329 Major depressive disorder, single episode, unspecified: Secondary | ICD-10-CM | POA: Diagnosis not present

## 2020-08-26 DIAGNOSIS — R7401 Elevation of levels of liver transaminase levels: Secondary | ICD-10-CM | POA: Diagnosis not present

## 2020-08-26 DIAGNOSIS — K2289 Other specified disease of esophagus: Secondary | ICD-10-CM | POA: Diagnosis not present

## 2020-08-26 DIAGNOSIS — F1020 Alcohol dependence, uncomplicated: Secondary | ICD-10-CM | POA: Diagnosis not present

## 2020-08-26 DIAGNOSIS — Z6832 Body mass index (BMI) 32.0-32.9, adult: Secondary | ICD-10-CM | POA: Diagnosis not present

## 2020-08-26 DIAGNOSIS — F419 Anxiety disorder, unspecified: Secondary | ICD-10-CM | POA: Diagnosis not present

## 2020-08-26 DIAGNOSIS — F1320 Sedative, hypnotic or anxiolytic dependence, uncomplicated: Secondary | ICD-10-CM | POA: Diagnosis not present

## 2020-08-26 DIAGNOSIS — J302 Other seasonal allergic rhinitis: Secondary | ICD-10-CM | POA: Diagnosis not present

## 2020-08-26 DIAGNOSIS — R197 Diarrhea, unspecified: Secondary | ICD-10-CM | POA: Diagnosis not present

## 2020-08-26 DIAGNOSIS — J45909 Unspecified asthma, uncomplicated: Secondary | ICD-10-CM | POA: Diagnosis not present

## 2020-08-26 DIAGNOSIS — F410 Panic disorder [episodic paroxysmal anxiety] without agoraphobia: Secondary | ICD-10-CM | POA: Diagnosis not present

## 2020-08-27 DIAGNOSIS — F1320 Sedative, hypnotic or anxiolytic dependence, uncomplicated: Secondary | ICD-10-CM | POA: Diagnosis not present

## 2020-08-27 DIAGNOSIS — F1020 Alcohol dependence, uncomplicated: Secondary | ICD-10-CM | POA: Diagnosis not present

## 2020-08-27 DIAGNOSIS — F329 Major depressive disorder, single episode, unspecified: Secondary | ICD-10-CM | POA: Diagnosis not present

## 2020-08-27 DIAGNOSIS — J302 Other seasonal allergic rhinitis: Secondary | ICD-10-CM | POA: Diagnosis not present

## 2020-08-27 DIAGNOSIS — Z6832 Body mass index (BMI) 32.0-32.9, adult: Secondary | ICD-10-CM | POA: Diagnosis not present

## 2020-08-27 DIAGNOSIS — R197 Diarrhea, unspecified: Secondary | ICD-10-CM | POA: Diagnosis not present

## 2020-08-27 DIAGNOSIS — F410 Panic disorder [episodic paroxysmal anxiety] without agoraphobia: Secondary | ICD-10-CM | POA: Diagnosis not present

## 2020-08-27 DIAGNOSIS — J45909 Unspecified asthma, uncomplicated: Secondary | ICD-10-CM | POA: Diagnosis not present

## 2020-08-27 DIAGNOSIS — F419 Anxiety disorder, unspecified: Secondary | ICD-10-CM | POA: Diagnosis not present

## 2020-08-27 DIAGNOSIS — R7401 Elevation of levels of liver transaminase levels: Secondary | ICD-10-CM | POA: Diagnosis not present

## 2020-08-27 DIAGNOSIS — K2289 Other specified disease of esophagus: Secondary | ICD-10-CM | POA: Diagnosis not present

## 2020-08-28 DIAGNOSIS — F419 Anxiety disorder, unspecified: Secondary | ICD-10-CM | POA: Diagnosis not present

## 2020-08-28 DIAGNOSIS — K2289 Other specified disease of esophagus: Secondary | ICD-10-CM | POA: Diagnosis not present

## 2020-08-28 DIAGNOSIS — F1320 Sedative, hypnotic or anxiolytic dependence, uncomplicated: Secondary | ICD-10-CM | POA: Diagnosis not present

## 2020-08-28 DIAGNOSIS — J302 Other seasonal allergic rhinitis: Secondary | ICD-10-CM | POA: Diagnosis not present

## 2020-08-28 DIAGNOSIS — F1020 Alcohol dependence, uncomplicated: Secondary | ICD-10-CM | POA: Diagnosis not present

## 2020-08-28 DIAGNOSIS — R7401 Elevation of levels of liver transaminase levels: Secondary | ICD-10-CM | POA: Diagnosis not present

## 2020-08-28 DIAGNOSIS — F329 Major depressive disorder, single episode, unspecified: Secondary | ICD-10-CM | POA: Diagnosis not present

## 2020-08-28 DIAGNOSIS — R197 Diarrhea, unspecified: Secondary | ICD-10-CM | POA: Diagnosis not present

## 2020-08-28 DIAGNOSIS — F410 Panic disorder [episodic paroxysmal anxiety] without agoraphobia: Secondary | ICD-10-CM | POA: Diagnosis not present

## 2020-08-28 DIAGNOSIS — J45909 Unspecified asthma, uncomplicated: Secondary | ICD-10-CM | POA: Diagnosis not present

## 2020-08-28 DIAGNOSIS — Z6832 Body mass index (BMI) 32.0-32.9, adult: Secondary | ICD-10-CM | POA: Diagnosis not present

## 2020-08-29 DIAGNOSIS — F419 Anxiety disorder, unspecified: Secondary | ICD-10-CM | POA: Diagnosis not present

## 2020-08-29 DIAGNOSIS — F410 Panic disorder [episodic paroxysmal anxiety] without agoraphobia: Secondary | ICD-10-CM | POA: Diagnosis not present

## 2020-08-29 DIAGNOSIS — R7401 Elevation of levels of liver transaminase levels: Secondary | ICD-10-CM | POA: Diagnosis not present

## 2020-08-29 DIAGNOSIS — J302 Other seasonal allergic rhinitis: Secondary | ICD-10-CM | POA: Diagnosis not present

## 2020-08-29 DIAGNOSIS — J45909 Unspecified asthma, uncomplicated: Secondary | ICD-10-CM | POA: Diagnosis not present

## 2020-08-29 DIAGNOSIS — F1320 Sedative, hypnotic or anxiolytic dependence, uncomplicated: Secondary | ICD-10-CM | POA: Diagnosis not present

## 2020-08-29 DIAGNOSIS — F329 Major depressive disorder, single episode, unspecified: Secondary | ICD-10-CM | POA: Diagnosis not present

## 2020-08-29 DIAGNOSIS — R197 Diarrhea, unspecified: Secondary | ICD-10-CM | POA: Diagnosis not present

## 2020-08-29 DIAGNOSIS — F1020 Alcohol dependence, uncomplicated: Secondary | ICD-10-CM | POA: Diagnosis not present

## 2020-08-29 DIAGNOSIS — Z6832 Body mass index (BMI) 32.0-32.9, adult: Secondary | ICD-10-CM | POA: Diagnosis not present

## 2020-08-29 DIAGNOSIS — K2289 Other specified disease of esophagus: Secondary | ICD-10-CM | POA: Diagnosis not present

## 2020-08-30 DIAGNOSIS — F1020 Alcohol dependence, uncomplicated: Secondary | ICD-10-CM | POA: Diagnosis not present

## 2020-08-30 DIAGNOSIS — F1320 Sedative, hypnotic or anxiolytic dependence, uncomplicated: Secondary | ICD-10-CM | POA: Diagnosis not present

## 2020-08-30 DIAGNOSIS — K2289 Other specified disease of esophagus: Secondary | ICD-10-CM | POA: Diagnosis not present

## 2020-08-30 DIAGNOSIS — F329 Major depressive disorder, single episode, unspecified: Secondary | ICD-10-CM | POA: Diagnosis not present

## 2020-08-30 DIAGNOSIS — Z6832 Body mass index (BMI) 32.0-32.9, adult: Secondary | ICD-10-CM | POA: Diagnosis not present

## 2020-08-30 DIAGNOSIS — R197 Diarrhea, unspecified: Secondary | ICD-10-CM | POA: Diagnosis not present

## 2020-08-30 DIAGNOSIS — J45909 Unspecified asthma, uncomplicated: Secondary | ICD-10-CM | POA: Diagnosis not present

## 2020-08-30 DIAGNOSIS — J302 Other seasonal allergic rhinitis: Secondary | ICD-10-CM | POA: Diagnosis not present

## 2020-08-30 DIAGNOSIS — F419 Anxiety disorder, unspecified: Secondary | ICD-10-CM | POA: Diagnosis not present

## 2020-08-30 DIAGNOSIS — R7401 Elevation of levels of liver transaminase levels: Secondary | ICD-10-CM | POA: Diagnosis not present

## 2020-08-30 DIAGNOSIS — F410 Panic disorder [episodic paroxysmal anxiety] without agoraphobia: Secondary | ICD-10-CM | POA: Diagnosis not present

## 2020-08-31 DIAGNOSIS — Z6832 Body mass index (BMI) 32.0-32.9, adult: Secondary | ICD-10-CM | POA: Diagnosis not present

## 2020-08-31 DIAGNOSIS — J302 Other seasonal allergic rhinitis: Secondary | ICD-10-CM | POA: Diagnosis not present

## 2020-08-31 DIAGNOSIS — R7401 Elevation of levels of liver transaminase levels: Secondary | ICD-10-CM | POA: Diagnosis not present

## 2020-08-31 DIAGNOSIS — F329 Major depressive disorder, single episode, unspecified: Secondary | ICD-10-CM | POA: Diagnosis not present

## 2020-08-31 DIAGNOSIS — R197 Diarrhea, unspecified: Secondary | ICD-10-CM | POA: Diagnosis not present

## 2020-08-31 DIAGNOSIS — F419 Anxiety disorder, unspecified: Secondary | ICD-10-CM | POA: Diagnosis not present

## 2020-08-31 DIAGNOSIS — K2289 Other specified disease of esophagus: Secondary | ICD-10-CM | POA: Diagnosis not present

## 2020-08-31 DIAGNOSIS — F410 Panic disorder [episodic paroxysmal anxiety] without agoraphobia: Secondary | ICD-10-CM | POA: Diagnosis not present

## 2020-08-31 DIAGNOSIS — J45909 Unspecified asthma, uncomplicated: Secondary | ICD-10-CM | POA: Diagnosis not present

## 2020-08-31 DIAGNOSIS — F1020 Alcohol dependence, uncomplicated: Secondary | ICD-10-CM | POA: Diagnosis not present

## 2020-08-31 DIAGNOSIS — F1320 Sedative, hypnotic or anxiolytic dependence, uncomplicated: Secondary | ICD-10-CM | POA: Diagnosis not present

## 2020-09-01 DIAGNOSIS — J45909 Unspecified asthma, uncomplicated: Secondary | ICD-10-CM | POA: Diagnosis not present

## 2020-09-01 DIAGNOSIS — Z6832 Body mass index (BMI) 32.0-32.9, adult: Secondary | ICD-10-CM | POA: Diagnosis not present

## 2020-09-01 DIAGNOSIS — F329 Major depressive disorder, single episode, unspecified: Secondary | ICD-10-CM | POA: Diagnosis not present

## 2020-09-01 DIAGNOSIS — J302 Other seasonal allergic rhinitis: Secondary | ICD-10-CM | POA: Diagnosis not present

## 2020-09-01 DIAGNOSIS — F419 Anxiety disorder, unspecified: Secondary | ICD-10-CM | POA: Diagnosis not present

## 2020-09-01 DIAGNOSIS — F410 Panic disorder [episodic paroxysmal anxiety] without agoraphobia: Secondary | ICD-10-CM | POA: Diagnosis not present

## 2020-09-01 DIAGNOSIS — F1020 Alcohol dependence, uncomplicated: Secondary | ICD-10-CM | POA: Diagnosis not present

## 2020-09-01 DIAGNOSIS — F1320 Sedative, hypnotic or anxiolytic dependence, uncomplicated: Secondary | ICD-10-CM | POA: Diagnosis not present

## 2020-09-01 DIAGNOSIS — R7401 Elevation of levels of liver transaminase levels: Secondary | ICD-10-CM | POA: Diagnosis not present

## 2020-09-01 DIAGNOSIS — K2289 Other specified disease of esophagus: Secondary | ICD-10-CM | POA: Diagnosis not present

## 2020-09-01 DIAGNOSIS — R197 Diarrhea, unspecified: Secondary | ICD-10-CM | POA: Diagnosis not present

## 2020-10-05 ENCOUNTER — Ambulatory Visit (INDEPENDENT_AMBULATORY_CARE_PROVIDER_SITE_OTHER): Payer: BC Managed Care – PPO

## 2020-10-05 ENCOUNTER — Encounter (HOSPITAL_COMMUNITY): Payer: Self-pay

## 2020-10-05 ENCOUNTER — Ambulatory Visit (HOSPITAL_COMMUNITY)
Admission: EM | Admit: 2020-10-05 | Discharge: 2020-10-05 | Disposition: A | Discharge status: Home / Self Care | Payer: BC Managed Care – PPO | Attending: Family Medicine | Admitting: Family Medicine

## 2020-10-05 DIAGNOSIS — M25511 Pain in right shoulder: Secondary | ICD-10-CM

## 2020-10-05 DIAGNOSIS — W19XXXA Unspecified fall, initial encounter: Secondary | ICD-10-CM

## 2020-10-05 DIAGNOSIS — Z043 Encounter for examination and observation following other accident: Secondary | ICD-10-CM | POA: Diagnosis not present

## 2020-10-05 MED ORDER — HYDROCODONE-ACETAMINOPHEN 5-325 MG PO TABS: 1.0000 | ORAL_TABLET | Freq: Four times a day (QID) | ORAL | 0 refills | Status: AC | PRN

## 2020-10-05 MED ORDER — DICLOFENAC SODIUM 75 MG PO TBEC: 75.0000 mg | DELAYED_RELEASE_TABLET | Freq: Two times a day (BID) | ORAL | 0 refills | Status: AC

## 2020-10-05 NOTE — Discharge Instructions (Signed)
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
## 2020-10-14 DIAGNOSIS — F411 Generalized anxiety disorder: Secondary | ICD-10-CM | POA: Diagnosis not present

## 2020-10-14 DIAGNOSIS — M25519 Pain in unspecified shoulder: Secondary | ICD-10-CM | POA: Diagnosis not present

## 2021-05-12 NOTE — Patient Outreach (Signed)
[facility]) Care Management ? ?05/12/2021 ? ?Jaymes Graff Edge ?[birth year] ?[id number] ? ? ?Received referral for of screening/assessments for Complex needs from Insurance plan. Assigned patient to [employer], LCSW for follow up ? ?Philmore Pali ?[place] Management Assistant ?[phone] ? ?

## 2021-05-23 ENCOUNTER — Telehealth: Payer: Self-pay | Admitting: *Deleted

## 2021-05-23 ENCOUNTER — Ambulatory Visit: Payer: Self-pay | Admitting: *Deleted

## 2021-05-23 NOTE — Patient Outreach (Signed)
?  Care Management  ? ?Follow Up Note ? ? ?05/23/2021 ? ?Name: Edward Grant MRN: [id number] DOB: [birth year] ? ?Referred By: Lenell Antu, DO ? ?Reason for Referral:  Complex Needs of ETOH and Psychoses. ? ?An unsuccessful telephone outreach was attempted today. The patient was referred to the case management team for assistance with care management and care coordination. A HIPAA compliant message was left on voicemail, providing contact information, encouraging patient to return LCSW's call at his earliest convenience.  LCSW will make a second initial telephone outreach call attempt within the next 5-7 business days, if a return call is not received from patient in the meantime. ? ?Follow-Up Plan:  06/08/2021 at 9:00 am ? ?Danford Bad, BSW, MSW, LCSW  ?Licensed Clinical Social Worker  ?[facility] Customer service manager Care Management ?[facility]  ?Mailing Address-[address] [place], [place] [address] ?Physical Address-[address] [place], [place], [place] [address] ?Toll Free Main # [phone] ?Fax # [phone] ?Cell # [phone] ?[email or web address] ? ? ? ? ? ?  ?

## 2021-06-08 ENCOUNTER — Encounter: Payer: Self-pay | Admitting: *Deleted

## 2021-06-08 ENCOUNTER — Ambulatory Visit: Payer: Self-pay | Admitting: *Deleted

## 2021-06-08 ENCOUNTER — Telehealth: Payer: Self-pay | Admitting: *Deleted

## 2021-06-08 NOTE — Patient Outreach (Signed)
?  Care Management  ? ?Follow Up Note ? ? ?06/08/2021 ? ?Name: MICHAELL GRIDER MRN: [id number] DOB: 1990/11/03 ? ?Referred By: Lenell Antu, DO ? ?Reason for Referral:  Complex Needs of ETOH and Psychoses. ? ?A second unsuccessful telephone outreach was attempted today. The patient was referred to the case management team for assistance with care management and care coordination. A HIPAA compliant message was left on voicemail, providing contact information, encouraging patient to return LCSW's call at his earliest convenience.  LCSW will make a third initial telephone outreach call attempt within the next 5-7 business days, if a return call is not received from patient in the meantime.  An Unsuccessful Outreach Letter has been mailed to patient's home. ? ?Follow-Up Plan:  06/14/2021 at 1:00 pm ? ?Danford Bad, BSW, MSW, LCSW  ?Licensed Clinical Social Worker  ?[facility] Customer service manager Care Management ?[facility]  ?Mailing Address-[address], [place], [place] [address] ?Physical Address-[address] [place], [place], [place] [address] ?Toll Free Main # [phone] ?Fax # [phone] ?Cell # [phone] ?[email or web address] ? ? ? ? ? ?  ?

## 2021-06-14 ENCOUNTER — Telehealth: Payer: Self-pay | Admitting: *Deleted

## 2021-06-14 ENCOUNTER — Ambulatory Visit: Payer: Self-pay | Admitting: *Deleted

## 2021-06-14 NOTE — Patient Outreach (Signed)
?  Care Management  ? ?Follow Up Note ? ? ?06/14/2021 ? ?Name: MAXDEN NAJI MRN: [id number] DOB: [birth year] ? ?Referred By: Lenell Antu, DO ? ?Reason for Referral:  Complex Needs of ETOH and Psychoses. ? ?Third unsuccessful telephone outreach was attempted today. The patient was referred to the case management team for assistance with care management and care coordination. The patient's primary care provider has been notified of our unsuccessful attempts to make or maintain contact with the patient. The care management team is pleased to engage with this patient at any time in the future should he/she be interested in assistance from the care management team.  ?A HIPAA compliant message was left on voicemail, providing contact information, encouraging patient to return LCSW's call at his earliest convenience.  LCSW will make a fourth and final initial telephone outreach call attempt within the next 30 days, if a return call is not received from patient in the meantime.   ?  ?Follow-Up Plan:  07/12/2021 at 10:00 am ? ?Danford Bad, BSW, MSW, LCSW  ?Licensed Clinical Social Worker  ?[facility] Customer service manager Care Management ?[facility]  ?Mailing Address-[address] [place], [place] [address] ?Physical Address-[address] [place], [place], [place] [address] ?Toll Free Main # [phone] ?Fax # [phone] ?Cell # [phone] ?[email or web address] ? ? ? ? ? ?  ?

## 2021-06-27 DIAGNOSIS — Z0000 Encounter for general adult medical examination without abnormal findings: Secondary | ICD-10-CM | POA: Diagnosis not present

## 2021-06-27 DIAGNOSIS — Z131 Encounter for screening for diabetes mellitus: Secondary | ICD-10-CM | POA: Diagnosis not present

## 2021-07-12 ENCOUNTER — Encounter: Payer: Self-pay | Admitting: *Deleted

## 2021-07-12 ENCOUNTER — Telehealth: Payer: Self-pay | Admitting: *Deleted

## 2021-07-12 ENCOUNTER — Ambulatory Visit: Payer: Self-pay | Admitting: *Deleted

## 2021-07-12 NOTE — Patient Outreach (Signed)
?  Care Management  ? ?Follow Up Note ? ? ?07/12/2021 ? ?Name: Edward Grant MRN: [id number] DOB: 1991/02/20 ? ?Referred By: Lenell Antu, DO ? ?Reason for Referral: Complex Needs of ETOH and Psychoses. ? ?Fourth and final unsuccessful telephone outreach was attempted today. The patient was referred to the case management team for assistance with care management and care coordination. The patient's primary care provider has been notified of our unsuccessful attempts to make initial contact with the patient. The care management team is pleased to engage with this patient at any time in the future should he/she be interested in assistance from the care management team. ?HIPAA compliant messages left on voicemail, providing contact information, encouraging patient to return LCSW's call at his earliest convenience, if he is interested in receiving social work services.  A Case Closure Letter has been mailed to patient's home.  ? ?No Follow-Up Required. ? ?Danford Bad, BSW, MSW, LCSW  ?Licensed Clinical Social Worker  ?[facility] Customer service manager Care Management ?[facility]  ?Mailing Address-[address], [place], [place] [address] ?Physical Address-[address] [place], [place], [place] [address] ?Toll Free Main # [phone] ?Fax # [phone] ?Cell # [phone] ?[email or web address] ? ? ? ? ? ?   ?

## 2021-08-15 DIAGNOSIS — F411 Generalized anxiety disorder: Secondary | ICD-10-CM | POA: Diagnosis not present

## 2022-01-25 DIAGNOSIS — F411 Generalized anxiety disorder: Secondary | ICD-10-CM | POA: Diagnosis not present

## 2022-01-25 DIAGNOSIS — F329 Major depressive disorder, single episode, unspecified: Secondary | ICD-10-CM | POA: Diagnosis not present

## 2022-02-23 ENCOUNTER — Telehealth: Payer: Self-pay | Admitting: *Deleted

## 2022-02-23 NOTE — Patient Outreach (Signed)
  Care Coordination   02/23/2022 Name: Edward Grant MRN: [id number] DOB: 1990-09-12   Care Coordination Outreach Attempts:  An unsuccessful telephone outreach was attempted today to offer the patient information about available care coordination services as a benefit of their health plan.   Follow Up Plan:  Additional outreach attempts will be made to offer the patient care coordination information and services.   Encounter Outcome:  No Answer   Care Coordination Interventions:  No, not indicated    Elliot Cousin, RN Care Management Coordinator [facility] [employer] Main Office [phone]

## 2022-04-05 DIAGNOSIS — N529 Male erectile dysfunction, unspecified: Secondary | ICD-10-CM | POA: Diagnosis not present

## 2022-04-05 DIAGNOSIS — F411 Generalized anxiety disorder: Secondary | ICD-10-CM | POA: Diagnosis not present

## 2022-08-16 ENCOUNTER — Ambulatory Visit
Admission: EM | Admit: 2022-08-16 | Discharge: 2022-08-16 | Disposition: A | Discharge status: Home / Self Care | Payer: BC Managed Care – PPO | Attending: Nurse Practitioner | Admitting: Nurse Practitioner

## 2022-08-16 DIAGNOSIS — A084 Viral intestinal infection, unspecified: Secondary | ICD-10-CM

## 2022-08-16 MED ORDER — ONDANSETRON 4 MG PO TBDP: 4.0000 mg | ORAL_TABLET | Freq: Three times a day (TID) | ORAL | 0 refills | Status: AC | PRN

## 2022-08-16 NOTE — Discharge Instructions (Signed)
You may take Zofran every 8 hours as needed for nausea or vomiting.  Continue hydration with Gatorade, Powerade, water Bland diet and advance as tolerated.  Examples include bananas, rice, applesauce, toast Follow-up with your PCP if your symptoms do not improve Please go to the emergency room if you develop any worsening symptoms Hope you feel better soon!

## 2022-08-16 NOTE — ED Provider Notes (Signed)
[facility]    CSN: [id number] Arrival date & time: 08/16/22  1419      History   Chief Complaint Chief Complaint  Patient presents with   Abdominal Pain    HPI Edward Grant is a 32 y.o. male presents for evaluation of nausea/vomiting/diarrhea.  Patient reports 3 days of nausea with nonbloody diarrhea.  Had some dry heaving episodes but this is since stopped.  Endorses fatigue with chills and bodyaches.  Denies any URI symptoms, abdominal pain, documented fevers.  No known sick contacts.  Denies any recent travel.  No history of GI diagnoses such as Crohn's, IBS, diverticulitis, colitis.  He states today he feels 50% better and was able to keep pop tarts and [place] yogurt down.  Denies dysuria.  No other concerns at this time.   Abdominal Pain Associated symptoms: chills, diarrhea, fatigue, nausea and vomiting     Past Medical History:  Diagnosis Date   Asthma     Patient Active Problem List   Diagnosis Date Noted   Esophageal obstruction due to food impaction    Alcohol use disorder, severe, dependence (HCC) 11/21/2016   Alcohol abuse 11/20/2016   Major depressive disorder, recurrent severe without psychotic features (HCC) 11/20/2016   ALLERGIC RHINITIS 07/05/2009   GASTROENTERITIS WITHOUT DEHYDRATION 06/08/2009   ANXIETY 08/17/2008   CONJUNCTIVITIS, ALLERGIC 04/26/2006   ASTHMA, INTERMITTENT 04/26/2006    Past Surgical History:  Procedure Laterality Date   ESOPHAGOGASTRODUODENOSCOPY (EGD) WITH PROPOFOL N/A 05/28/2020   Procedure: ESOPHAGOGASTRODUODENOSCOPY (EGD) WITH PROPOFOL;  Surgeon: Lynann Bologna, MD;  Location: [facility] ENDOSCOPY;  Service: Endoscopy;  Laterality: N/A;   FOREIGN BODY REMOVAL  05/28/2020   Procedure: FOREIGN BODY REMOVAL;  Surgeon: Lynann Bologna, MD;  Location: [facility] ENDOSCOPY;  Service: Endoscopy;;       Home Medications    Prior to Admission medications   Medication Sig Start Date End Date Taking? Authorizing Provider  ondansetron  (ZOFRAN-ODT) 4 MG disintegrating tablet Take 1 tablet (4 mg total) by mouth every 8 (eight) hours as needed for nausea or vomiting. 08/16/22  Yes Radford Pax, NP  ALPRAZolam Prudy Feeler) 1 MG tablet Take 1 mg by mouth 2 (two) times daily as needed for anxiety. 04/30/20   [provider]  diclofenac (VOLTAREN) 75 MG EC tablet Take 1 tablet (75 mg total) by mouth 2 (two) times daily. 10/05/20   Mardella Layman, MD  dicyclomine (BENTYL) 20 MG tablet Take 1 tablet (20 mg total) by mouth 2 (two) times daily. 08/04/20   Fayrene Helper, PA-C  escitalopram (LEXAPRO) 10 MG tablet Take 10 mg by mouth every morning. 09/10/20   [provider]  FLUoxetine (PROZAC) 20 MG capsule Take 3 capsules (60 mg total) by mouth daily. For depression Patient not taking: No sig reported 11/23/16   Armandina Stammer I, NP  HYDROcodone-acetaminophen (NORCO/VICODIN) 5-325 MG tablet Take 1 tablet by mouth every 6 (six) hours as needed for moderate pain or severe pain. 10/05/20   Mardella Layman, MD  hydrOXYzine (ATARAX/VISTARIL) 25 MG tablet Take 1 tablet (25 mg total) by mouth every 6 (six) hours as needed for anxiety. 08/04/20   Fayrene Helper, PA-C  omeprazole (PRILOSEC OTC) 20 MG tablet Take 1 tablet (20 mg total) by mouth 2 (two) times daily. 05/28/20 05/28/21  Lynann Bologna, MD    Family History History reviewed. No pertinent family history.  Social History Social History   Tobacco Use   Smoking status: Former    Packs/day: .5  Types: Cigarettes   Smokeless tobacco: Former    Types: Chew  Substance Use Topics   Alcohol use: Yes    Alcohol/week: 126.0 standard drinks of alcohol    Types: 126 Shots of liquor per week   Drug use: No     Allergies   Patient has no known allergies.   Review of Systems Review of Systems  Constitutional:  Positive for chills and fatigue.  Gastrointestinal:  Positive for diarrhea, nausea and vomiting.     Physical Exam Triage Vital Signs ED Triage Vitals  Enc Vitals Group     BP  08/16/22 1502 111/76     Pulse Rate 08/16/22 1502 89     Resp 08/16/22 1502 16     Temp 08/16/22 1502 98.6 F (37 C)     Temp Source 08/16/22 1502 Oral     SpO2 08/16/22 1502 96 %     Weight --      Height --      Head Circumference --      Peak Flow --      Pain Score 08/16/22 1503 5     Pain Loc --      Pain Edu? --      Excl. in GC? --    No data found.  Updated Vital Signs BP 111/76 (BP Location: Left Arm)   Pulse 89   Temp 98.6 F (37 C) (Oral)   Resp 16   SpO2 96%   Visual Acuity Right Eye Distance:   Left Eye Distance:   Bilateral Distance:    Right Eye Near:   Left Eye Near:    Bilateral Near:     Physical Exam Vitals and nursing note reviewed.  Constitutional:      General: He is not in acute distress.    Appearance: Normal appearance. He is not ill-appearing, toxic-appearing or diaphoretic.  HENT:     Head: Normocephalic and atraumatic.  Eyes:     Pupils: Pupils are equal, round, and reactive to light.  Cardiovascular:     Rate and Rhythm: Normal rate and regular rhythm.     Heart sounds: Normal heart sounds.  Pulmonary:     Effort: Pulmonary effort is normal.     Breath sounds: Normal breath sounds.  Abdominal:     General: Abdomen is flat. Bowel sounds are normal.     Palpations: There is no hepatomegaly or splenomegaly.     Tenderness: There is no abdominal tenderness. Negative signs include Rovsing's sign and McBurney's sign.  Skin:    General: Skin is warm and dry.  Neurological:     General: No focal deficit present.     Mental Status: He is alert and oriented to person, place, and time.  Psychiatric:        Mood and Affect: Mood normal.        Behavior: Behavior normal.      UC Treatments / Results  Labs (all labs ordered are listed, but only abnormal results are displayed) Labs Reviewed - No data to display  EKG   Radiology No results found.  Procedures Procedures (including critical care time)  Medications Ordered in  UC Medications - No data to display  Initial Impression / Assessment and Plan / UC Course  I have reviewed the triage vital signs and the nursing notes.  Pertinent labs & imaging results that were available during my care of the patient were reviewed by me and considered in my medical decision making (see  chart for details).     Reviewed exam and symptoms with patient.  No red flags.  He is well-appearing with stable vital signs and in no acute distress.  Discussed viral enteritis and symptomatic treatment Zofran as needed Bland diet and advance as tolerated.  Encouraged hydration PCP follow-up if symptoms do not improve ER precautions reviewed and patient verbalized understanding Final Clinical Impressions(s) / UC Diagnoses   Final diagnoses:  Viral gastroenteritis     Discharge Instructions      You may take Zofran every 8 hours as needed for nausea or vomiting.  Continue hydration with Gatorade, Powerade, water Bland diet and advance as tolerated.  Examples include bananas, rice, applesauce, toast Follow-up with your PCP if your symptoms do not improve Please go to the emergency room if you develop any worsening symptoms Hope you feel better soon!   ED Prescriptions     Medication Sig Dispense Auth. Provider   ondansetron (ZOFRAN-ODT) 4 MG disintegrating tablet Take 1 tablet (4 mg total) by mouth every 8 (eight) hours as needed for nausea or vomiting. 10 tablet Radford Pax, NP      PDMP not reviewed this encounter.   Radford Pax, NP 08/16/22 1536

## 2022-08-16 NOTE — ED Triage Notes (Signed)
Pt presents with c/o stomach pain X 3 days. Pt has been sleeping a lot and having cold sweats. \  Pt states he has not taken anything at home for relief.

## 2022-10-05 ENCOUNTER — Ambulatory Visit
Admission: EM | Admit: 2022-10-05 | Discharge: 2022-10-05 | Disposition: A | Discharge status: Home / Self Care | Payer: BC Managed Care – PPO | Attending: Internal Medicine | Admitting: Internal Medicine

## 2022-10-05 ENCOUNTER — Ambulatory Visit (INDEPENDENT_AMBULATORY_CARE_PROVIDER_SITE_OTHER): Payer: BC Managed Care – PPO

## 2022-10-05 DIAGNOSIS — S6391XA Sprain of unspecified part of right wrist and hand, initial encounter: Secondary | ICD-10-CM

## 2022-10-05 DIAGNOSIS — M79641 Pain in right hand: Secondary | ICD-10-CM | POA: Diagnosis not present

## 2022-10-05 NOTE — Discharge Instructions (Signed)
Your x-rays of your right hand were negative for fracture or dislocation. You likely sprained your hand.   Wear the wrist brace we provided in the clinic for the next couple of weeks to provide compression, stability, and comfort.  Please rest, ice, and elevate your hand to help it heal and decrease inflammation.   Take 600mg  ibuprofen and/or 1,000mg  tylenol every 6 hours as needed for pain and inflammation. Take with food to avoid stomach upset.  Call the orthopedic provider listed on your discharge paperwork to schedule a follow-up appointment if your symptoms do not improve in the next 1-2 weeks with supportive care.  Return to urgent care if you experience worsening pain, numbness, tingling, change of color in your skin near the injury, or any other concerning symptoms.  I hope you feel better!

## 2022-10-05 NOTE — ED Provider Notes (Signed)
[facility]    CSN: [id number] Arrival date & time: 10/05/22  1601      History   Chief Complaint Chief Complaint  Patient presents with   Numbness   Fall        Hand Pain    HPI Edward Grant is a 32 y.o. male.   Patient presents to urgent care for evaluation of pain to the dorsum of right hand as result of fall 5 days ago on Saturday, September 30, 2022.  Patient was walking up concrete steps and tripped as he was avoiding to step on his dog causing him to trip and fall onto the right hand/wrist.  He did not hit his head during the injury and denies laceration/abrasion to the right hand.  No LOC.  He did not feel any pain or numbness immediately after injury but the hand did become swollen.  He states the night of the injury, he slept with his wrist bent and woke up the next morning with numbness and tingling sensation to the right pinky finger and the ulnar aspect of the right hand.  Over the last couple of days, numbness/tingling sensation has turned into a throbbing pain to the right hand that is worse with movement.  He returned to work a couple of days ago after the fall but had to call out of work today since he works with his hands and the right hand is hurting.  Reports previous injury to the right thumb (closed reduction of right thumb MCP fracture in 2018).  No other previous injuries to the ulnar aspect of the right hand.  He has been taking ibuprofen with some relief of pain.  Of note, heart rate is elevated at 112 and blood pressure is 97/67.  This is lower than patient's baseline.  History of alcohol abuse per chart review, patient denies recent intake of alcohol and reports drinking plenty of water.  Denies heart palpitations, chest pain, dizziness, recent syncope, nausea, vomiting, and viral URI symptoms.  No fevers or chills.  Denies recent drug or alcohol use.    Fall  Hand Pain    Past Medical History:  Diagnosis Date   Asthma     Patient Active  Problem List   Diagnosis Date Noted   Esophageal obstruction due to food impaction    Alcohol use disorder, severe, dependence (HCC) 11/21/2016   Alcohol abuse 11/20/2016   Major depressive disorder, recurrent severe without psychotic features (HCC) 11/20/2016   ALLERGIC RHINITIS 07/05/2009   GASTROENTERITIS WITHOUT DEHYDRATION 06/08/2009   ANXIETY 08/17/2008   CONJUNCTIVITIS, ALLERGIC 04/26/2006   ASTHMA, INTERMITTENT 04/26/2006    Past Surgical History:  Procedure Laterality Date   ESOPHAGOGASTRODUODENOSCOPY (EGD) WITH PROPOFOL N/A 05/28/2020   Procedure: ESOPHAGOGASTRODUODENOSCOPY (EGD) WITH PROPOFOL;  Surgeon: Lynann Bologna, MD;  Location: [facility] ENDOSCOPY;  Service: Endoscopy;  Laterality: N/A;   FOREIGN BODY REMOVAL  05/28/2020   Procedure: FOREIGN BODY REMOVAL;  Surgeon: Lynann Bologna, MD;  Location: [facility] ENDOSCOPY;  Service: Endoscopy;;       Home Medications    Prior to Admission medications   Medication Sig Start Date End Date Taking? Authorizing Provider  ALPRAZolam Prudy Feeler) 1 MG tablet Take 1 mg by mouth 2 (two) times daily as needed for anxiety. 04/30/20   [provider]  diclofenac (VOLTAREN) 75 MG EC tablet Take 1 tablet (75 mg total) by mouth 2 (two) times daily. 10/05/20   Mardella Layman, MD  dicyclomine (BENTYL) 20 MG tablet Take 1 tablet (  20 mg total) by mouth 2 (two) times daily. 08/04/20   Fayrene Helper, PA-C  escitalopram (LEXAPRO) 10 MG tablet Take 10 mg by mouth every morning. 09/10/20   [provider]  FLUoxetine (PROZAC) 20 MG capsule Take 3 capsules (60 mg total) by mouth daily. For depression Patient not taking: Reported on 05/28/2020 11/23/16   Armandina Stammer I, NP  HYDROcodone-acetaminophen (NORCO/VICODIN) 5-325 MG tablet Take 1 tablet by mouth every 6 (six) hours as needed for moderate pain or severe pain. 10/05/20   Mardella Layman, MD  hydrOXYzine (ATARAX/VISTARIL) 25 MG tablet Take 1 tablet (25 mg total) by mouth every 6 (six) hours as needed for anxiety.  08/04/20   Fayrene Helper, PA-C  omeprazole (PRILOSEC OTC) 20 MG tablet Take 1 tablet (20 mg total) by mouth 2 (two) times daily. 05/28/20 05/28/21  Lynann Bologna, MD  ondansetron (ZOFRAN-ODT) 4 MG disintegrating tablet Take 1 tablet (4 mg total) by mouth every 8 (eight) hours as needed for nausea or vomiting. 08/16/22   Radford Pax, NP    Family History History reviewed. No pertinent family history.  Social History Social History   Tobacco Use   Smoking status: Former    Current packs/day: 0.50    Types: Cigarettes   Smokeless tobacco: Former    Types: Chew  Substance Use Topics   Alcohol use: Yes    Alcohol/week: 126.0 standard drinks of alcohol    Types: 126 Shots of liquor per week   Drug use: No     Allergies   Patient has no known allergies.   Review of Systems Review of Systems Per HPI  Physical Exam Triage Vital Signs ED Triage Vitals  Encounter Vitals Group     BP 10/05/22 1607 97/67     Systolic BP Percentile --      Diastolic BP Percentile --      Pulse Rate 10/05/22 1607 (!) 112     Resp 10/05/22 1607 18     Temp 10/05/22 1607 97.8 F (36.6 C)     Temp Source 10/05/22 1607 Oral     SpO2 10/05/22 1607 96 %     Weight --      Height --      Head Circumference --      Peak Flow --      Pain Score 10/05/22 1612 7     Pain Loc --      Pain Education --      Exclude from Growth Chart --    No data found.  Updated Vital Signs BP 97/67 (BP Location: Right Arm)   Pulse (!) 112   Temp 97.8 F (36.6 C) (Oral)   Resp 18   SpO2 96%   Visual Acuity Right Eye Distance:   Left Eye Distance:   Bilateral Distance:    Right Eye Near:   Left Eye Near:    Bilateral Near:     Physical Exam Vitals and nursing note reviewed.  Constitutional:      Appearance: He is not ill-appearing or toxic-appearing.  HENT:     Head: Normocephalic and atraumatic.     Right Ear: Hearing and external ear normal.     Left Ear: Hearing and external ear normal.     Nose: Nose  normal.     Mouth/Throat:     Lips: Pink.  Eyes:     General: Lids are normal. Vision grossly intact. Gaze aligned appropriately.     Extraocular Movements: Extraocular movements intact.  Conjunctiva/sclera: Conjunctivae normal.  Pulmonary:     Effort: Pulmonary effort is normal.  Musculoskeletal:     Right hand: Swelling (subtle swelling to the dorsum of the right hand without erythema, warmth, or rash) and tenderness present. No deformity, lacerations or bony tenderness. Normal range of motion. Normal strength. Normal sensation (Sensation intact to distal right upper extremity, particularly right fifth digit). There is no disruption of two-point discrimination. Normal capillary refill. Normal pulse (+2 right radial pulse present).     Left hand: Normal.       Hands:     Cervical back: Neck supple.     Comments: 5/5 grip strength bilateral upper extremities.  Sensation intact bilateral upper extremities.  Skin:    General: Skin is warm and dry.     Capillary Refill: Capillary refill takes less than 2 seconds.     Findings: No rash.  Neurological:     General: No focal deficit present.     Mental Status: He is alert and oriented to person, place, and time. Mental status is at baseline.     Cranial Nerves: No dysarthria or facial asymmetry.  Psychiatric:        Mood and Affect: Mood normal.        Speech: Speech normal.        Behavior: Behavior normal.        Thought Content: Thought content normal.        Judgment: Judgment normal.      UC Treatments / Results  Labs (all labs ordered are listed, but only abnormal results are displayed) Labs Reviewed - No data to display  EKG   Radiology DG Hand Complete Right  Result Date: 10/05/2022 CLINICAL DATA:  Pain after fall EXAM: RIGHT HAND - COMPLETE 3 VIEW COMPARISON:  None Available. FINDINGS: There is no evidence of fracture or dislocation. There is no evidence of arthropathy or other focal bone abnormality. Soft tissues  are unremarkable. IMPRESSION: No acute osseous abnormality. Electronically Signed   By: Karen Kays M.D.   On: 10/05/2022 16:24    Procedures Procedures (including critical care time)  Medications Ordered in UC Medications - No data to display  Initial Impression / Assessment and Plan / UC Course  I have reviewed the triage vital signs and the nursing notes.  Pertinent labs & imaging results that were available during my care of the patient were reviewed by me and considered in my medical decision making (see chart for details).   1.  Hand sprain right, tachycardia Imaging is negative for acute fracture or dislocation.  We will manage this with conservative treatment as an acute sprain to the right hand. RICE advised.  Wrist brace applied in clinic and patient may use this as needed for compression and stability. May continue over the counter medications as needed for pain. Walking referral to orthopedics given should symptoms fail to improve in the next few weeks with conservative care.   Tachycardia and soft BP likely related to dehydration, however patient reports he is anxious to come to the doctors office. Encouraged to increase water intake to stay well hydrated. He is tolerating PO fluids without N/V and appears well hydrated to physical exam.   Counseled patient on potential for adverse effects with medications prescribed/recommended today, strict ER and return-to-clinic precautions discussed, patient verbalized understanding.    Final Clinical Impressions(s) / UC Diagnoses   Final diagnoses:  Hand sprain, right, initial encounter     Discharge Instructions  Your x-rays of your right hand were negative for fracture or dislocation. You likely sprained your hand.   Wear the wrist brace we provided in the clinic for the next couple of weeks to provide compression, stability, and comfort.  Please rest, ice, and elevate your hand to help it heal and decrease inflammation.    Take 600mg  ibuprofen and/or 1,000mg  tylenol every 6 hours as needed for pain and inflammation. Take with food to avoid stomach upset.  Call the orthopedic provider listed on your discharge paperwork to schedule a follow-up appointment if your symptoms do not improve in the next 1-2 weeks with supportive care.  Return to urgent care if you experience worsening pain, numbness, tingling, change of color in your skin near the injury, or any other concerning symptoms.  I hope you feel better!     ED Prescriptions   None    PDMP not reviewed this encounter.   Carlisle Beers, [place] 10/05/22 1655

## 2022-10-05 NOTE — ED Triage Notes (Addendum)
Pt reports pain in right hand, right pinky finger deformity x 4 days. Reports he slipped and fell on the steps at his house when he was walking his dog, but he did not feel anything at the moment. Ibuprofen gives some relief.

## 2022-10-23 DIAGNOSIS — M79641 Pain in right hand: Secondary | ICD-10-CM | POA: Diagnosis not present

## 2022-11-08 DIAGNOSIS — G5631 Lesion of radial nerve, right upper limb: Secondary | ICD-10-CM | POA: Diagnosis not present

## 2022-11-08 DIAGNOSIS — G5621 Lesion of ulnar nerve, right upper limb: Secondary | ICD-10-CM | POA: Diagnosis not present

## 2022-11-08 DIAGNOSIS — M79644 Pain in right finger(s): Secondary | ICD-10-CM | POA: Diagnosis not present

## 2022-12-14 DIAGNOSIS — F411 Generalized anxiety disorder: Secondary | ICD-10-CM | POA: Diagnosis not present

## 2022-12-14 DIAGNOSIS — F410 Panic disorder [episodic paroxysmal anxiety] without agoraphobia: Secondary | ICD-10-CM | POA: Diagnosis not present

## 2022-12-14 DIAGNOSIS — J208 Acute bronchitis due to other specified organisms: Secondary | ICD-10-CM | POA: Diagnosis not present

## 2023-01-30 DIAGNOSIS — F411 Generalized anxiety disorder: Secondary | ICD-10-CM | POA: Diagnosis not present

## 2023-03-16 DIAGNOSIS — T7840XA Allergy, unspecified, initial encounter: Secondary | ICD-10-CM | POA: Diagnosis not present

## 2023-03-20 DIAGNOSIS — Z800 Family history of malignant neoplasm of digestive organs: Secondary | ICD-10-CM | POA: Diagnosis not present

## 2023-03-20 DIAGNOSIS — F1010 Alcohol abuse, uncomplicated: Secondary | ICD-10-CM | POA: Diagnosis not present

## 2023-03-20 DIAGNOSIS — Z634 Disappearance and death of family member: Secondary | ICD-10-CM | POA: Diagnosis not present

## 2023-03-20 DIAGNOSIS — Z638 Other specified problems related to primary support group: Secondary | ICD-10-CM | POA: Diagnosis not present

## 2023-03-20 DIAGNOSIS — Z79899 Other long term (current) drug therapy: Secondary | ICD-10-CM | POA: Diagnosis not present

## 2023-03-20 DIAGNOSIS — F32A Depression, unspecified: Secondary | ICD-10-CM | POA: Diagnosis not present

## 2023-03-20 DIAGNOSIS — F411 Generalized anxiety disorder: Secondary | ICD-10-CM | POA: Diagnosis not present

## 2023-03-20 DIAGNOSIS — Z758 Other problems related to medical facilities and other health care: Secondary | ICD-10-CM | POA: Diagnosis not present

## 2023-03-20 DIAGNOSIS — F1510 Other stimulant abuse, uncomplicated: Secondary | ICD-10-CM | POA: Diagnosis not present

## 2023-03-20 DIAGNOSIS — R000 Tachycardia, unspecified: Secondary | ICD-10-CM | POA: Diagnosis not present

## 2023-03-20 DIAGNOSIS — J45909 Unspecified asthma, uncomplicated: Secondary | ICD-10-CM | POA: Diagnosis not present

## 2023-03-20 DIAGNOSIS — F1524 Other stimulant dependence with stimulant-induced mood disorder: Secondary | ICD-10-CM | POA: Diagnosis not present

## 2023-03-20 DIAGNOSIS — F15259 Other stimulant dependence with stimulant-induced psychotic disorder, unspecified: Secondary | ICD-10-CM | POA: Diagnosis not present

## 2023-03-20 DIAGNOSIS — F419 Anxiety disorder, unspecified: Secondary | ICD-10-CM | POA: Diagnosis not present

## 2023-03-20 DIAGNOSIS — Z811 Family history of alcohol abuse and dependence: Secondary | ICD-10-CM | POA: Diagnosis not present

## 2023-03-20 DIAGNOSIS — F15251 Other stimulant dependence with stimulant-induced psychotic disorder with hallucinations: Secondary | ICD-10-CM | POA: Diagnosis not present

## 2023-03-20 DIAGNOSIS — F17210 Nicotine dependence, cigarettes, uncomplicated: Secondary | ICD-10-CM | POA: Diagnosis not present

## 2023-03-23 DIAGNOSIS — F1520 Other stimulant dependence, uncomplicated: Secondary | ICD-10-CM | POA: Diagnosis not present

## 2023-03-23 DIAGNOSIS — F15251 Other stimulant dependence with stimulant-induced psychotic disorder with hallucinations: Secondary | ICD-10-CM | POA: Diagnosis not present

## 2023-03-23 DIAGNOSIS — F1010 Alcohol abuse, uncomplicated: Secondary | ICD-10-CM | POA: Diagnosis not present

## 2023-03-23 DIAGNOSIS — F1524 Other stimulant dependence with stimulant-induced mood disorder: Secondary | ICD-10-CM | POA: Diagnosis not present
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
## 2023-04-25 DIAGNOSIS — F1911 Other psychoactive substance abuse, in remission: Secondary | ICD-10-CM | POA: Diagnosis not present

## 2023-04-25 DIAGNOSIS — F410 Panic disorder [episodic paroxysmal anxiety] without agoraphobia: Secondary | ICD-10-CM | POA: Diagnosis not present

## 2023-04-25 DIAGNOSIS — F329 Major depressive disorder, single episode, unspecified: Secondary | ICD-10-CM | POA: Diagnosis not present

## 2023-04-25 DIAGNOSIS — F411 Generalized anxiety disorder: Secondary | ICD-10-CM | POA: Diagnosis not present

## 2023-05-13 DIAGNOSIS — R404 Transient alteration of awareness: Secondary | ICD-10-CM | POA: Diagnosis not present

## 2023-05-29 DEATH — deceased
